# Patient Record
Sex: Female | Born: 1945 | Race: Black or African American | Hispanic: No | Marital: Married | Smoking: Never smoker
Health system: Southern US, Community
[De-identification: ages and names within clinical notes are randomized; demographics above are authoritative.]

## PROBLEM LIST (undated history)

## (undated) ENCOUNTER — Ambulatory Visit: Admission: EM | Payer: Medicare Other

## (undated) DIAGNOSIS — E119 Type 2 diabetes mellitus without complications: Secondary | ICD-10-CM

## (undated) DIAGNOSIS — E785 Hyperlipidemia, unspecified: Secondary | ICD-10-CM

## (undated) DIAGNOSIS — I10 Essential (primary) hypertension: Secondary | ICD-10-CM

## (undated) DIAGNOSIS — K589 Irritable bowel syndrome without diarrhea: Secondary | ICD-10-CM

## (undated) DIAGNOSIS — M549 Dorsalgia, unspecified: Secondary | ICD-10-CM

## (undated) DIAGNOSIS — K5792 Diverticulitis of intestine, part unspecified, without perforation or abscess without bleeding: Secondary | ICD-10-CM

## (undated) DIAGNOSIS — K219 Gastro-esophageal reflux disease without esophagitis: Secondary | ICD-10-CM

## (undated) DIAGNOSIS — M47812 Spondylosis without myelopathy or radiculopathy, cervical region: Secondary | ICD-10-CM

## (undated) DIAGNOSIS — R42 Dizziness and giddiness: Secondary | ICD-10-CM

## (undated) DIAGNOSIS — F419 Anxiety disorder, unspecified: Secondary | ICD-10-CM

## (undated) DIAGNOSIS — M810 Age-related osteoporosis without current pathological fracture: Secondary | ICD-10-CM

## (undated) HISTORY — DX: Anxiety disorder, unspecified: F41.9

## (undated) HISTORY — DX: Age-related osteoporosis without current pathological fracture: M81.0

## (undated) HISTORY — PX: ABDOMINAL HYSTERECTOMY: SHX81

## (undated) HISTORY — DX: Dizziness and giddiness: R42

## (undated) HISTORY — DX: Diverticulitis of intestine, part unspecified, without perforation or abscess without bleeding: K57.92

## (undated) HISTORY — PX: JOINT REPLACEMENT: SHX530

## (undated) HISTORY — PX: OTHER SURGICAL HISTORY: SHX169

## (undated) HISTORY — DX: Dorsalgia, unspecified: M54.9

## (undated) HISTORY — DX: Spondylosis without myelopathy or radiculopathy, cervical region: M47.812

## (undated) HISTORY — DX: Irritable bowel syndrome, unspecified: K58.9

---

## 2005-06-05 ENCOUNTER — Ambulatory Visit: Payer: Self-pay | Admitting: General Practice

## 2006-06-10 ENCOUNTER — Ambulatory Visit: Payer: Self-pay | Admitting: Internal Medicine

## 2007-07-28 ENCOUNTER — Ambulatory Visit: Payer: Self-pay | Admitting: Nurse Practitioner

## 2008-01-14 ENCOUNTER — Other Ambulatory Visit: Payer: Self-pay

## 2008-01-14 ENCOUNTER — Emergency Department: Payer: Self-pay | Admitting: Internal Medicine

## 2008-01-18 ENCOUNTER — Ambulatory Visit: Payer: Self-pay | Admitting: Internal Medicine

## 2008-08-07 ENCOUNTER — Ambulatory Visit: Payer: Self-pay | Admitting: Family Medicine

## 2009-06-06 ENCOUNTER — Ambulatory Visit: Payer: Self-pay | Admitting: Family Medicine

## 2009-07-06 ENCOUNTER — Ambulatory Visit: Payer: Self-pay | Admitting: Family Medicine

## 2009-08-06 ENCOUNTER — Ambulatory Visit: Payer: Self-pay | Admitting: Family Medicine

## 2009-11-20 ENCOUNTER — Ambulatory Visit: Payer: Self-pay

## 2010-11-28 ENCOUNTER — Ambulatory Visit: Payer: Self-pay | Admitting: Family Medicine

## 2011-02-09 ENCOUNTER — Emergency Department: Payer: Self-pay | Admitting: Emergency Medicine

## 2011-03-01 ENCOUNTER — Emergency Department: Payer: Self-pay | Admitting: Emergency Medicine

## 2011-04-25 ENCOUNTER — Ambulatory Visit: Payer: Self-pay | Admitting: Specialist

## 2011-05-02 ENCOUNTER — Ambulatory Visit: Payer: Self-pay | Admitting: Specialist

## 2011-05-22 ENCOUNTER — Ambulatory Visit: Payer: Self-pay | Admitting: Specialist

## 2012-01-07 ENCOUNTER — Ambulatory Visit: Payer: Self-pay | Admitting: Family Medicine

## 2012-05-06 ENCOUNTER — Ambulatory Visit: Payer: Self-pay | Admitting: Specialist

## 2012-05-18 ENCOUNTER — Ambulatory Visit: Payer: Self-pay | Admitting: Specialist

## 2012-05-18 DIAGNOSIS — I10 Essential (primary) hypertension: Secondary | ICD-10-CM

## 2012-05-26 ENCOUNTER — Ambulatory Visit: Payer: Self-pay | Admitting: Specialist

## 2013-01-11 ENCOUNTER — Ambulatory Visit: Payer: Self-pay | Admitting: Family Medicine

## 2014-01-12 ENCOUNTER — Ambulatory Visit: Payer: Self-pay | Admitting: Internal Medicine

## 2015-01-16 ENCOUNTER — Ambulatory Visit
Admit: 2015-01-16 | Disposition: A | Discharge status: Home / Self Care | Payer: Self-pay | Attending: Internal Medicine | Admitting: Internal Medicine

## 2015-01-23 NOTE — Op Note (Signed)
PATIENT NAME:  Lindsey Choi, Lindsey Choi MR#:  [id number] DATE OF BIRTH:  04-23-1946  DATE OF PROCEDURE:  05/26/2012  PREOPERATIVE DIAGNOSES:  1. Severe tearing, maceration of left lateral meniscus with anterior extrusion of the meniscus.  2. 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 3. Reactive synovitis.   POSTOPERATIVE DIAGNOSES:  1. Severe tearing, maceration of left lateral meniscus with anterior extrusion of the meniscus.  2. 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 3. Reactive synovitis.   PROCEDURES:  1. Arthroscopic partial lateral meniscectomy.  2. Arthroscopic chondroplasty of lateral femoral condyle and tibia.  3. Partial synovectomy.   SURGEON: Valinda HoarHoward E. Huntley Knoop, MD   ANESTHESIA: General LMA.   COMPLICATIONS: None.   DRAINS: None.   OPERATIVE FINDINGS: The patient had severe maceration of the lateral meniscus with anterior fragment extruded and flipped over between joint surfaces. The posterior portion was macerated. There was eburnated bone on the lateral tibial plateau and some of the femur. Articularly the weight-bearing surface did not have as much damage. The intercondylar notch was normal with normal ligaments and the medial compartment was normal. Patellofemoral joint shows minimal wear. There were no loose bodies. The suprapatellar pouch was intact.   DESCRIPTION OF PROCEDURE: The patient was brought to the Operating Room where she underwent satisfactory general LMA anesthesia in the supine position. The left leg was prepped and draped in sterile fashion and arthroscopy carried out through standard portals. The above findings were encountered. The lateral meniscus was debrided with basket forceps, motorized resector and ArthroCare wand back to healthy clean tissue which did not impinge in the joint. The lateral femoral condyle and tibia were minimally touched up. The synovitis was removed with the motorized resector and the ArthroCare  wand. The remainder of the joint did not require any significant treatment. Pump pressure was decreased to allow for cauterization of bleeders. The wound was thoroughly irrigated and stab wounds closed with 3-0 nylon suture. 0.5% Marcaine with epinephrine and morphine was placed in the joint. The tourniquet was not used. A dry sterile dressing was applied and the patient was awakened and taken to recovery in good condition. ____________________________ Valinda HoarHoward E. Agnes Brightbill, MD hem:slb D: 05/26/2012 13:08:08 ET T: 05/26/2012 14:36:00 ET JOB#: [id number]  cc: Valinda HoarHoward E. Satya Buttram, MD, <Dictator> Valinda HoarHOWARD E Tamarius Rosenfield MD ELECTRONICALLY SIGNED 05/26/2012 15:20

## 2015-12-12 ENCOUNTER — Other Ambulatory Visit: Payer: Self-pay | Admitting: Family

## 2015-12-12 DIAGNOSIS — Z1231 Encounter for screening mammogram for malignant neoplasm of breast: Secondary | ICD-10-CM

## 2016-01-17 ENCOUNTER — Ambulatory Visit
Admission: RE | Admit: 2016-01-17 | Discharge: 2016-01-17 | Disposition: A | Discharge status: Home / Self Care | Payer: Medicare Other | Source: Ambulatory Visit | Attending: Family | Admitting: Family

## 2016-01-17 DIAGNOSIS — Z1231 Encounter for screening mammogram for malignant neoplasm of breast: Secondary | ICD-10-CM | POA: Insufficient documentation

## 2016-11-27 ENCOUNTER — Other Ambulatory Visit: Payer: Self-pay | Admitting: Specialist

## 2016-11-27 DIAGNOSIS — Z1231 Encounter for screening mammogram for malignant neoplasm of breast: Secondary | ICD-10-CM

## 2017-01-20 ENCOUNTER — Ambulatory Visit
Admission: RE | Admit: 2017-01-20 | Discharge: 2017-01-20 | Disposition: A | Discharge status: Home / Self Care | Payer: Medicare Other | Source: Ambulatory Visit | Attending: Specialist | Admitting: Specialist

## 2017-01-20 DIAGNOSIS — Z1231 Encounter for screening mammogram for malignant neoplasm of breast: Secondary | ICD-10-CM

## 2017-03-09 ENCOUNTER — Other Ambulatory Visit: Payer: Self-pay | Admitting: Specialist

## 2017-03-11 ENCOUNTER — Encounter
Admission: RE | Admit: 2017-03-11 | Discharge: 2017-03-11 | Disposition: A | Discharge status: Home / Self Care | Payer: Medicare Other | Source: Ambulatory Visit | Attending: Specialist | Admitting: Specialist

## 2017-03-11 DIAGNOSIS — M25862 Other specified joint disorders, left knee: Secondary | ICD-10-CM | POA: Insufficient documentation

## 2017-03-11 DIAGNOSIS — Z01812 Encounter for preprocedural laboratory examination: Secondary | ICD-10-CM | POA: Insufficient documentation

## 2017-03-11 LAB — URINALYSIS, ROUTINE W REFLEX MICROSCOPIC
Bilirubin Urine: NEGATIVE
GLUCOSE, UA: NEGATIVE mg/dL
Hgb urine dipstick: NEGATIVE
Ketones, ur: NEGATIVE mg/dL
Nitrite: NEGATIVE
PROTEIN: NEGATIVE mg/dL
Specific Gravity, Urine: 1.003 — ABNORMAL LOW (ref 1.005–1.030)
pH: 6 (ref 5.0–8.0)

## 2017-03-11 LAB — CBC WITH DIFFERENTIAL/PLATELET
BASOS ABS: 0 10*3/uL (ref 0–0.1)
BASOS PCT: 1 %
EOS ABS: 0.1 10*3/uL (ref 0–0.7)
Eosinophils Relative: 2 %
HCT: 36.8 % (ref 35.0–47.0)
Hemoglobin: 12.4 g/dL (ref 12.0–16.0)
Lymphocytes Relative: 38 %
Lymphs Abs: 2.5 10*3/uL (ref 1.0–3.6)
MCH: 28.6 pg (ref 26.0–34.0)
MCHC: 33.6 g/dL (ref 32.0–36.0)
MCV: 85.2 fL (ref 80.0–100.0)
MONO ABS: 0.4 10*3/uL (ref 0.2–0.9)
MONOS PCT: 6 %
Neutro Abs: 3.5 10*3/uL (ref 1.4–6.5)
Neutrophils Relative %: 53 %
PLATELETS: 325 10*3/uL (ref 150–440)
RBC: 4.33 MIL/uL (ref 3.80–5.20)
RDW: 13.6 % (ref 11.5–14.5)
WBC: 6.6 10*3/uL (ref 3.6–11.0)

## 2017-03-11 LAB — COMPREHENSIVE METABOLIC PANEL
ALBUMIN: 4.5 g/dL (ref 3.5–5.0)
ALT: 15 U/L (ref 14–54)
ANION GAP: 7 (ref 5–15)
AST: 16 U/L (ref 15–41)
Alkaline Phosphatase: 46 U/L (ref 38–126)
BUN: 13 mg/dL (ref 6–20)
CHLORIDE: 102 mmol/L (ref 101–111)
CO2: 29 mmol/L (ref 22–32)
Calcium: 10 mg/dL (ref 8.9–10.3)
Creatinine, Ser: 0.91 mg/dL (ref 0.44–1.00)
GFR calc Af Amer: >60 mL/min (ref >60)
GFR calc non Af Amer: >60 mL/min (ref >60)
GLUCOSE: 100 mg/dL — AB (ref 65–99)
POTASSIUM: 3.4 mmol/L — AB (ref 3.5–5.1)
SODIUM: 138 mmol/L (ref 135–145)
TOTAL PROTEIN: 7.9 g/dL (ref 6.5–8.1)
Total Bilirubin: 1.3 mg/dL — ABNORMAL HIGH (ref 0.3–1.2)

## 2017-03-11 LAB — PROTIME-INR
INR: 1.06
Prothrombin Time: 13.8 seconds (ref 11.4–15.2)

## 2017-03-11 LAB — SURGICAL PCR SCREEN
MRSA, PCR: NEGATIVE
Staphylococcus aureus: POSITIVE — AB

## 2017-03-11 LAB — TYPE AND SCREEN
ABO/RH(D): O POS
ANTIBODY SCREEN: NEGATIVE

## 2017-03-11 LAB — APTT: APTT: 33 s (ref 24–36)

## 2017-03-11 MED ORDER — VANCOMYCIN HCL IN DEXTROSE 1-5 GM/200ML-% IV SOLN
INTRAVENOUS | Status: AC
  Filled 2017-03-11: qty 200

## 2017-03-11 NOTE — Patient Instructions (Signed)
Your procedure is scheduled on: 03/25/17 Report to Same Day Surgery 2nd floor medical mall [facility] Entrance-take elevator on left to 2nd floor.  Check in with surgery information desk.) To find out your arrival time please call [phone] between 1PM - 3PM on  03/24/17  Remember: Instructions that are not followed completely may result in serious medical risk, up to and including death, or upon the discretion of your surgeon and anesthesiologist your surgery may need to be rescheduled.    _x___ 1. Do not eat food or drink liquids after midnight. No gum chewing or                              hard candies.     __x__ 2. No Alcohol for 24 hours before or after surgery.   __x__3. No Smoking for 24 prior to surgery.   ____  4. Bring all medications with you on the day of surgery if instructed.    __x__ 5. Notify your doctor if there is any change in your medical condition     (cold, fever, infections).     Do not wear jewelry, make-up, hairpins, clips or nail polish.  Do not wear lotions, powders, or perfumes. You may wear deodorant.  Do not shave 48 hours prior to surgery. Men may shave face and neck.  Do not bring valuables to the hospital.    [facility] is not responsible for any belongings or valuables.               Contacts, dentures or bridgework may not be worn into surgery.  Leave your suitcase in the car. After surgery it may be brought to your room.  For patients admitted to the hospital, discharge time is determined by your                       treatment team.   Patients discharged the day of surgery will not be allowed to drive home.  You will need someone to drive you home and stay with you the night of your procedure.    Please read over the following fact sheets that you were given:   [facility] Preparing for Surgery and or MRSA Information   _x___ Take anti-hypertensive (unless it includes a diuretic), cardiac, seizure, asthma,     anti-reflux and psychiatric  medicines. These include:  1. ATORVASTATIN  2. PEPCID  3.  4.  5.  6.  ____Fleets enema or Magnesium Citrate as directed.   _x___ Use CHG Soap or sage wipes as directed on instruction sheet   ____ Use inhalers on the day of surgery and bring to hospital day of surgery  ____ Stop Metformin and Janumet 2 days prior to surgery.    ____ Take 1/2 of usual insulin dose the night before surgery and none on the morning     surgery.   _x___ Follow recommendations from Cardiologist, Pulmonologist or PCP regarding          stopping Aspirin, Coumadin, Pllavix ,Eliquis, Effient, or Pradaxa, and Pletal.            STOP ASPIRIN 7 DAYS BEFORE SURGERY X____Stop Anti-inflammatories such as Advil, Aleve, Ibuprofen, Motrin, Naproxen, Naprosyn, Goodies powders or aspirin products. OK to take Tylenol an  Celebrex.   _x___ Stop supplements until after surgery.  But may continue Vitamin D, Vitamin B, and multivitamin.  STOP FISH OIL UNTIL AFTER SURGERY  ____ Bring C-Pap to the hospital.

## 2017-03-12 LAB — HEMOGLOBIN A1C
HEMOGLOBIN A1C: 6.2 % — AB (ref 4.8–5.6)
MEAN PLASMA GLUCOSE: 131 mg/dL

## 2017-03-12 NOTE — Pre-Procedure Instructions (Signed)
Lindsey Choi AT [facility]. DR Hyacinth MeekerMILLER CALLED IN MUCIPROCIN AND WANTS HER TO USE CHG SOAP UNTIL SURGERY. PATIENT CAME BY PAT AND GIVEN MORE CHG SOAP

## 2017-03-25 ENCOUNTER — Inpatient Hospital Stay
Admission: RE | Admit: 2017-03-25 | Discharge: 2017-03-27 | DRG: 470 | Disposition: A | Discharge status: Skilled Nursing Facility | Payer: Medicare Other | Source: Ambulatory Visit | Attending: Specialist | Admitting: Specialist

## 2017-03-25 ENCOUNTER — Encounter
Admission: RE | Disposition: A | Discharge status: Skilled Nursing Facility | Payer: Self-pay | Source: Ambulatory Visit | Attending: Specialist

## 2017-03-25 ENCOUNTER — Inpatient Hospital Stay: Payer: Medicare Other

## 2017-03-25 ENCOUNTER — Inpatient Hospital Stay: Payer: Medicare Other | Admitting: Certified Registered Nurse Anesthetist

## 2017-03-25 ENCOUNTER — Encounter: Payer: Self-pay | Admitting: *Deleted

## 2017-03-25 DIAGNOSIS — R7303 Prediabetes: Secondary | ICD-10-CM | POA: Diagnosis present

## 2017-03-25 DIAGNOSIS — Z7982 Long term (current) use of aspirin: Secondary | ICD-10-CM | POA: Diagnosis not present

## 2017-03-25 DIAGNOSIS — K219 Gastro-esophageal reflux disease without esophagitis: Secondary | ICD-10-CM | POA: Diagnosis present

## 2017-03-25 DIAGNOSIS — M25562 Pain in left knee: Secondary | ICD-10-CM | POA: Diagnosis present

## 2017-03-25 DIAGNOSIS — Z96659 Presence of unspecified artificial knee joint: Secondary | ICD-10-CM

## 2017-03-25 DIAGNOSIS — M1712 Unilateral primary osteoarthritis, left knee: Secondary | ICD-10-CM | POA: Diagnosis present

## 2017-03-25 HISTORY — DX: Gastro-esophageal reflux disease without esophagitis: K21.9

## 2017-03-25 HISTORY — DX: Type 2 diabetes mellitus without complications: E11.9

## 2017-03-25 HISTORY — DX: Hyperlipidemia, unspecified: E78.5

## 2017-03-25 HISTORY — DX: Essential (primary) hypertension: I10

## 2017-03-25 HISTORY — PX: TOTAL KNEE ARTHROPLASTY: SHX125

## 2017-03-25 LAB — CREATININE, SERUM
CREATININE: 0.81 mg/dL (ref 0.44–1.00)
GFR calc Af Amer: >60 mL/min (ref >60)
GFR calc non Af Amer: >60 mL/min (ref >60)

## 2017-03-25 LAB — CBC
HCT: 33.7 % — ABNORMAL LOW (ref 35.0–47.0)
HEMOGLOBIN: 11.6 g/dL — AB (ref 12.0–16.0)
MCH: 29.4 pg (ref 26.0–34.0)
MCHC: 34.5 g/dL (ref 32.0–36.0)
MCV: 85.1 fL (ref 80.0–100.0)
Platelets: 283 10*3/uL (ref 150–440)
RBC: 3.96 MIL/uL (ref 3.80–5.20)
RDW: 13.3 % (ref 11.5–14.5)
WBC: 8.2 10*3/uL (ref 3.6–11.0)

## 2017-03-25 LAB — ABO/RH: ABO/RH(D): O POS

## 2017-03-25 SURGERY — ARTHROPLASTY, KNEE, TOTAL
Anesthesia: Spinal | Laterality: Left | Wound class: Clean

## 2017-03-25 MED ORDER — SODIUM CHLORIDE 0.45 % IV SOLN
INTRAVENOUS | Status: DC
  Administered 2017-03-25 – 2017-03-26 (×2): via INTRAVENOUS

## 2017-03-25 MED ORDER — ENOXAPARIN SODIUM 40 MG/0.4ML ~~LOC~~ SOLN
40.0000 mg | SUBCUTANEOUS | Status: DC
  Administered 2017-03-26 – 2017-03-27 (×2): 40 mg via SUBCUTANEOUS
  Filled 2017-03-25 (×2): qty 0.4

## 2017-03-25 MED ORDER — CELECOXIB 200 MG PO CAPS
ORAL_CAPSULE | ORAL | Status: AC
  Administered 2017-03-25: 400 mg via ORAL
  Filled 2017-03-25: qty 2

## 2017-03-25 MED ORDER — SODIUM CHLORIDE 0.9 % IV SOLN
INTRAVENOUS | Status: DC | PRN
  Administered 2017-03-25: 1000 mg via INTRAVENOUS

## 2017-03-25 MED ORDER — CYCLOBENZAPRINE HCL 10 MG PO TABS: 10.0000 mg | ORAL_TABLET | Freq: Three times a day (TID) | ORAL | Status: DC | PRN

## 2017-03-25 MED ORDER — METHOCARBAMOL 1000 MG/10ML IJ SOLN
500.0000 mg | Freq: Four times a day (QID) | INTRAVENOUS | Status: DC | PRN
  Filled 2017-03-25: qty 5

## 2017-03-25 MED ORDER — PROPOFOL 500 MG/50ML IV EMUL
INTRAVENOUS | Status: AC
  Filled 2017-03-25: qty 50

## 2017-03-25 MED ORDER — GLYCOPYRROLATE 0.2 MG/ML IJ SOLN
INTRAMUSCULAR | Status: DC | PRN
  Administered 2017-03-25: 0.2 mg via INTRAVENOUS

## 2017-03-25 MED ORDER — VITAMIN C 500 MG PO TABS
500.0000 mg | ORAL_TABLET | Freq: Two times a day (BID) | ORAL | Status: DC
  Administered 2017-03-26 – 2017-03-27 (×3): 500 mg via ORAL
  Filled 2017-03-25 (×5): qty 1

## 2017-03-25 MED ORDER — PROPOFOL 500 MG/50ML IV EMUL
INTRAVENOUS | Status: DC | PRN
  Administered 2017-03-25: 50 ug/kg/min via INTRAVENOUS

## 2017-03-25 MED ORDER — ACETAMINOPHEN 325 MG PO TABS
650.0000 mg | ORAL_TABLET | Freq: Four times a day (QID) | ORAL | Status: DC | PRN
  Administered 2017-03-26: 650 mg via ORAL
  Filled 2017-03-25: qty 2

## 2017-03-25 MED ORDER — TETRACAINE HCL 1 % IJ SOLN
INTRAMUSCULAR | Status: DC | PRN
  Administered 2017-03-25: 10 mg via INTRASPINAL

## 2017-03-25 MED ORDER — METHOCARBAMOL 500 MG PO TABS: 500.0000 mg | ORAL_TABLET | Freq: Four times a day (QID) | ORAL | Status: DC | PRN

## 2017-03-25 MED ORDER — MORPHINE SULFATE (PF) 4 MG/ML IV SOLN
INTRAVENOUS | Status: AC
  Filled 2017-03-25: qty 1

## 2017-03-25 MED ORDER — PHENOL 1.4 % MT LIQD
1.0000 | OROMUCOSAL | Status: DC | PRN
  Filled 2017-03-25: qty 177

## 2017-03-25 MED ORDER — EPHEDRINE SULFATE 50 MG/ML IJ SOLN
INTRAMUSCULAR | Status: DC | PRN
  Administered 2017-03-25 (×2): 10 mg via INTRAVENOUS

## 2017-03-25 MED ORDER — NEOMYCIN-POLYMYXIN B GU 40-200000 IR SOLN
Status: AC
  Filled 2017-03-25: qty 20

## 2017-03-25 MED ORDER — SEVOFLURANE IN SOLN
RESPIRATORY_TRACT | Status: AC
  Filled 2017-03-25: qty 250

## 2017-03-25 MED ORDER — ENOXAPARIN SODIUM 30 MG/0.3ML ~~LOC~~ SOLN: 30.0000 mg | SUBCUTANEOUS | Status: DC

## 2017-03-25 MED ORDER — CHLORHEXIDINE GLUCONATE CLOTH 2 % EX PADS
6.0000 | MEDICATED_PAD | Freq: Once | CUTANEOUS | Status: AC
  Administered 2017-03-11: 6 via TOPICAL

## 2017-03-25 MED ORDER — ZOLPIDEM TARTRATE 5 MG PO TABS: 5.0000 mg | ORAL_TABLET | Freq: Every evening | ORAL | Status: DC | PRN

## 2017-03-25 MED ORDER — BUPIVACAINE HCL (PF) 0.5 % IJ SOLN
INTRAMUSCULAR | Status: AC
Start: 2017-03-25 — End: 2017-03-25
  Filled 2017-03-25: qty 10

## 2017-03-25 MED ORDER — BISACODYL 10 MG RE SUPP: 10.0000 mg | Freq: Every day | RECTAL | Status: DC | PRN

## 2017-03-25 MED ORDER — VANCOMYCIN HCL IN DEXTROSE 1-5 GM/200ML-% IV SOLN
INTRAVENOUS | Status: AC
  Administered 2017-03-25: 1000 mg via INTRAVENOUS
  Filled 2017-03-25: qty 200

## 2017-03-25 MED ORDER — METOCLOPRAMIDE HCL 10 MG PO TABS: 5.0000 mg | ORAL_TABLET | Freq: Three times a day (TID) | ORAL | Status: DC | PRN

## 2017-03-25 MED ORDER — BUPIVACAINE HCL (PF) 0.5 % IJ SOLN
INTRAMUSCULAR | Status: DC | PRN
  Administered 2017-03-25: 2 mL

## 2017-03-25 MED ORDER — VANCOMYCIN HCL IN DEXTROSE 1-5 GM/200ML-% IV SOLN
1000.0000 mg | INTRAVENOUS | Status: AC
  Administered 2017-03-25: 1000 mg via INTRAVENOUS

## 2017-03-25 MED ORDER — FENTANYL CITRATE (PF) 100 MCG/2ML IJ SOLN
INTRAMUSCULAR | Status: AC
  Filled 2017-03-25: qty 2

## 2017-03-25 MED ORDER — ONDANSETRON HCL 4 MG PO TABS
4.0000 mg | ORAL_TABLET | Freq: Four times a day (QID) | ORAL | Status: DC | PRN
  Administered 2017-03-25: 4 mg via ORAL
  Filled 2017-03-25: qty 1

## 2017-03-25 MED ORDER — ONDANSETRON HCL 4 MG/2ML IJ SOLN: 4.0000 mg | Freq: Four times a day (QID) | INTRAMUSCULAR | Status: DC | PRN

## 2017-03-25 MED ORDER — FAMOTIDINE 20 MG PO TABS
ORAL_TABLET | ORAL | Status: AC
  Administered 2017-03-25: 20 mg
  Filled 2017-03-25: qty 1

## 2017-03-25 MED ORDER — SENNA 8.6 MG PO TABS
1.0000 | ORAL_TABLET | Freq: Two times a day (BID) | ORAL | Status: DC
  Administered 2017-03-25 – 2017-03-26 (×2): 8.6 mg via ORAL
  Filled 2017-03-25 (×4): qty 1

## 2017-03-25 MED ORDER — MORPHINE SULFATE (PF) 4 MG/ML IV SOLN
INTRAVENOUS | Status: DC | PRN
  Administered 2017-03-25: 4 mg via INTRAVENOUS

## 2017-03-25 MED ORDER — CELECOXIB 200 MG PO CAPS
200.0000 mg | ORAL_CAPSULE | Freq: Two times a day (BID) | ORAL | Status: DC
  Administered 2017-03-25 – 2017-03-27 (×4): 200 mg via ORAL
  Filled 2017-03-25 (×4): qty 1

## 2017-03-25 MED ORDER — MAGNESIUM HYDROXIDE 400 MG/5ML PO SUSP
30.0000 mL | Freq: Every day | ORAL | Status: DC | PRN
  Administered 2017-03-26: 30 mL via ORAL
  Filled 2017-03-25: qty 30

## 2017-03-25 MED ORDER — ACETAMINOPHEN 650 MG RE SUPP: 650.0000 mg | Freq: Four times a day (QID) | RECTAL | Status: DC | PRN

## 2017-03-25 MED ORDER — DIPHENHYDRAMINE HCL 12.5 MG/5ML PO ELIX
12.5000 mg | ORAL_SOLUTION | ORAL | Status: DC | PRN
Start: 2017-03-25 — End: 2017-03-27

## 2017-03-25 MED ORDER — ATORVASTATIN CALCIUM 10 MG PO TABS
10.0000 mg | ORAL_TABLET | Freq: Every morning | ORAL | Status: DC
  Administered 2017-03-26 – 2017-03-27 (×2): 10 mg via ORAL
  Filled 2017-03-25 (×2): qty 1

## 2017-03-25 MED ORDER — PREGABALIN 75 MG PO CAPS
75.0000 mg | ORAL_CAPSULE | Freq: Once | ORAL | Status: AC
  Administered 2017-03-25: 75 mg via ORAL

## 2017-03-25 MED ORDER — SODIUM CHLORIDE 0.9 % IJ SOLN
INTRAMUSCULAR | Status: AC
Start: 2017-03-25 — End: 2017-03-25
  Filled 2017-03-25: qty 50

## 2017-03-25 MED ORDER — MENTHOL 3 MG MT LOZG
1.0000 | LOZENGE | OROMUCOSAL | Status: DC | PRN
  Filled 2017-03-25: qty 9

## 2017-03-25 MED ORDER — PREGABALIN 75 MG PO CAPS
ORAL_CAPSULE | ORAL | Status: AC
  Administered 2017-03-25: 75 mg via ORAL
  Filled 2017-03-25: qty 1

## 2017-03-25 MED ORDER — ONDANSETRON HCL 4 MG/2ML IJ SOLN
INTRAMUSCULAR | Status: AC
Start: 2017-03-25 — End: 2017-03-26
  Filled 2017-03-25: qty 2

## 2017-03-25 MED ORDER — CELECOXIB 200 MG PO CAPS
400.0000 mg | ORAL_CAPSULE | ORAL | Status: AC
  Administered 2017-03-25: 400 mg via ORAL

## 2017-03-25 MED ORDER — BUPIVACAINE HCL (PF) 0.25 % IJ SOLN
INTRAMUSCULAR | Status: DC | PRN
  Administered 2017-03-25: 30 mL

## 2017-03-25 MED ORDER — GABAPENTIN 300 MG PO CAPS
300.0000 mg | ORAL_CAPSULE | Freq: Three times a day (TID) | ORAL | Status: DC
  Administered 2017-03-25 – 2017-03-27 (×6): 300 mg via ORAL
  Filled 2017-03-25 (×6): qty 1

## 2017-03-25 MED ORDER — HYDROCODONE-ACETAMINOPHEN 7.5-325 MG PO TABS: 1.0000 | ORAL_TABLET | ORAL | Status: DC | PRN

## 2017-03-25 MED ORDER — FERROUS SULFATE 325 (65 FE) MG PO TABS
325.0000 mg | ORAL_TABLET | Freq: Three times a day (TID) | ORAL | Status: DC
  Administered 2017-03-25 – 2017-03-27 (×6): 325 mg via ORAL
  Filled 2017-03-25 (×6): qty 1

## 2017-03-25 MED ORDER — BUPIVACAINE HCL (PF) 0.5 % IJ SOLN
INTRAMUSCULAR | Status: AC
  Filled 2017-03-25: qty 30

## 2017-03-25 MED ORDER — GLYCOPYRROLATE 0.2 MG/ML IJ SOLN
INTRAMUSCULAR | Status: AC
Start: 2017-03-25 — End: 2017-03-25
  Filled 2017-03-25: qty 1

## 2017-03-25 MED ORDER — TRANEXAMIC ACID 1000 MG/10ML IV SOLN
1000.0000 mg | Freq: Once | INTRAVENOUS | Status: AC
  Administered 2017-03-25: 1000 mg via INTRAVENOUS
  Filled 2017-03-25: qty 10

## 2017-03-25 MED ORDER — TRANEXAMIC ACID 1000 MG/10ML IV SOLN
1000.0000 mg | INTRAVENOUS | Status: DC
  Filled 2017-03-25: qty 10

## 2017-03-25 MED ORDER — CALCIUM CARBONATE-VITAMIN D 500-200 MG-UNIT PO TABS
ORAL_TABLET | Freq: Every day | ORAL | Status: DC
Start: 2017-03-25 — End: 2017-03-27
  Administered 2017-03-26 – 2017-03-27 (×2): 1 via ORAL
  Filled 2017-03-25 (×2): qty 1

## 2017-03-25 MED ORDER — OMEGA-3-ACID ETHYL ESTERS 1 G PO CAPS
1.0000 g | ORAL_CAPSULE | Freq: Every day | ORAL | Status: DC
  Administered 2017-03-26 – 2017-03-27 (×2): 1 g via ORAL
  Filled 2017-03-25 (×2): qty 1

## 2017-03-25 MED ORDER — KETOROLAC TROMETHAMINE 30 MG/ML IJ SOLN
INTRAMUSCULAR | Status: AC
Start: 2017-03-25 — End: 2017-03-25
  Filled 2017-03-25: qty 1

## 2017-03-25 MED ORDER — LACTATED RINGERS IV SOLN
INTRAVENOUS | Status: DC
  Administered 2017-03-25 (×2): via INTRAVENOUS

## 2017-03-25 MED ORDER — FAMOTIDINE 20 MG PO TABS: 40.0000 mg | ORAL_TABLET | Freq: Two times a day (BID) | ORAL | Status: DC | PRN

## 2017-03-25 MED ORDER — SODIUM CHLORIDE 0.9 % IJ SOLN
INTRAMUSCULAR | Status: AC
  Filled 2017-03-25: qty 20

## 2017-03-25 MED ORDER — BUPIVACAINE LIPOSOME 1.3 % IJ SUSP
INTRAMUSCULAR | Status: DC | PRN
  Administered 2017-03-25: 60 mL

## 2017-03-25 MED ORDER — KETOROLAC TROMETHAMINE 30 MG/ML IJ SOLN
INTRAMUSCULAR | Status: DC | PRN
  Administered 2017-03-25: 30 mg via INTRAVENOUS

## 2017-03-25 MED ORDER — FLEET ENEMA 7-19 GM/118ML RE ENEM: 1.0000 | ENEMA | Freq: Once | RECTAL | Status: DC | PRN

## 2017-03-25 MED ORDER — ONDANSETRON HCL 4 MG/2ML IJ SOLN
4.0000 mg | Freq: Once | INTRAMUSCULAR | Status: AC | PRN
  Administered 2017-03-25: 4 mg via INTRAVENOUS

## 2017-03-25 MED ORDER — SODIUM CHLORIDE 0.9 % IV SOLN
INTRAVENOUS | Status: DC | PRN
  Administered 2017-03-25: 25 ug/min via INTRAVENOUS

## 2017-03-25 MED ORDER — BUPIVACAINE LIPOSOME 1.3 % IJ SUSP
INTRAMUSCULAR | Status: AC
  Filled 2017-03-25: qty 20

## 2017-03-25 MED ORDER — ACETAMINOPHEN 500 MG PO TABS
1000.0000 mg | ORAL_TABLET | Freq: Four times a day (QID) | ORAL | Status: AC | PRN
  Administered 2017-03-25 – 2017-03-26 (×2): 1000 mg via ORAL
  Filled 2017-03-25 (×2): qty 2

## 2017-03-25 MED ORDER — BUPIVACAINE HCL (PF) 0.25 % IJ SOLN
INTRAMUSCULAR | Status: AC
  Filled 2017-03-25: qty 30

## 2017-03-25 MED ORDER — MORPHINE SULFATE (PF) 2 MG/ML IV SOLN: 1.0000 mg | INTRAVENOUS | Status: DC | PRN

## 2017-03-25 MED ORDER — FENTANYL CITRATE (PF) 100 MCG/2ML IJ SOLN
INTRAMUSCULAR | Status: DC | PRN
  Administered 2017-03-25: 25 ug via INTRAVENOUS
  Administered 2017-03-25: 50 ug via INTRAVENOUS
  Administered 2017-03-25: 25 ug via INTRAVENOUS

## 2017-03-25 MED ORDER — ALUM & MAG HYDROXIDE-SIMETH 200-200-20 MG/5ML PO SUSP: 30.0000 mL | ORAL | Status: DC | PRN

## 2017-03-25 MED ORDER — FENTANYL CITRATE (PF) 100 MCG/2ML IJ SOLN: 25.0000 ug | INTRAMUSCULAR | Status: DC | PRN

## 2017-03-25 MED ORDER — METOCLOPRAMIDE HCL 5 MG/ML IJ SOLN
5.0000 mg | Freq: Three times a day (TID) | INTRAMUSCULAR | Status: DC | PRN
Start: 2017-03-25 — End: 2017-03-27

## 2017-03-25 MED ORDER — VANCOMYCIN HCL IN DEXTROSE 1-5 GM/200ML-% IV SOLN
1000.0000 mg | Freq: Two times a day (BID) | INTRAVENOUS | Status: AC
  Administered 2017-03-25 – 2017-03-26 (×2): 1000 mg via INTRAVENOUS
  Filled 2017-03-25 (×2): qty 200

## 2017-03-25 SURGICAL SUPPLY — 51 items
AUTOTRANSFUS HAS 1/8 (MISCELLANEOUS) ×3
BLADE DEBAKEY 8.0 (BLADE) ×4 IMPLANT
BLADE DEBAKEY 8.0MM (BLADE) ×2
BLADE SAGITTAL WIDE XTHICK NO (BLADE) ×3 IMPLANT
CANISTER SUCT 1200ML W/VALVE (MISCELLANEOUS) ×3 IMPLANT
CANISTER SUCT 3000ML PPV (MISCELLANEOUS) ×3 IMPLANT
CAP KNEE TOTAL 3 SIGMA ×3 IMPLANT
CATH TRAY METER 16FR LF (MISCELLANEOUS) ×3 IMPLANT
CEMENT HV SMART SET (Cement) ×6 IMPLANT
CHLORAPREP W/TINT 26ML (MISCELLANEOUS) ×6 IMPLANT
COOLER POLAR GLACIER W/PUMP (MISCELLANEOUS) ×3 IMPLANT
CUFF TOURN 24 STER (MISCELLANEOUS) IMPLANT
CUFF TOURN 30 STER DUAL PORT (MISCELLANEOUS) IMPLANT
DRAPE INCISE IOBAN 66X60 STRL (DRAPES) ×3 IMPLANT
DRAPE SHEET LG 3/4 BI-LAMINATE (DRAPES) ×6 IMPLANT
DRSG AQUACEL AG ADV 3.5X10 (GAUZE/BANDAGES/DRESSINGS) ×3 IMPLANT
DRSG AQUACEL AG ADV 3.5X14 (GAUZE/BANDAGES/DRESSINGS) ×3 IMPLANT
ELECT REM PT RETURN 9FT ADLT (ELECTROSURGICAL) ×3
ELECTRODE REM PT RTRN 9FT ADLT (ELECTROSURGICAL) ×1 IMPLANT
GLOVE BIO SURGEON STRL SZ7.5 (GLOVE) ×3 IMPLANT
GLOVE BIO SURGEON STRL SZ8 (GLOVE) ×3 IMPLANT
GLOVE BIOGEL PI IND STRL 8.5 (GLOVE) ×1 IMPLANT
GLOVE BIOGEL PI INDICATOR 8.5 (GLOVE) ×2
GLOVE INDICATOR 8.0 STRL GRN (GLOVE) ×3 IMPLANT
GLOVE SURG ORTHO 8.5 STRL (GLOVE) ×3 IMPLANT
GOWN STRL REUS W/ TWL LRG LVL4 (GOWN DISPOSABLE) ×1 IMPLANT
GOWN STRL REUS W/TWL LRG LVL4 (GOWN DISPOSABLE) ×5 IMPLANT
IMMBOLIZER KNEE 19 BLUE UNIV (SOFTGOODS) ×3 IMPLANT
KIT RM TURNOVER STRD PROC AR (KITS) ×3 IMPLANT
NEEDLE SPNL 20GX3.5 QUINCKE YW (NEEDLE) ×3 IMPLANT
NS IRRIG 1000ML POUR BTL (IV SOLUTION) ×3 IMPLANT
PACK TOTAL KNEE (MISCELLANEOUS) ×3 IMPLANT
PAD WRAPON POLAR KNEE (MISCELLANEOUS) ×1 IMPLANT
PULSAVAC PLUS IRRIG FAN TIP (DISPOSABLE) ×3
SOL .9 NS 3000ML IRR  AL (IV SOLUTION) ×2
SOL .9 NS 3000ML IRR UROMATIC (IV SOLUTION) ×1 IMPLANT
SPONGE LAP 18X18 5 PK (GAUZE/BANDAGES/DRESSINGS) IMPLANT
STAPLER SKIN PROX 35W (STAPLE) ×3 IMPLANT
SUCTION FRAZIER HANDLE 10FR (MISCELLANEOUS) ×2
SUCTION TUBE FRAZIER 10FR DISP (MISCELLANEOUS) ×1 IMPLANT
SUT BONE WAX W31G (SUTURE) ×3 IMPLANT
SUT DVC 2 QUILL PDO  T11 36X36 (SUTURE) ×2
SUT DVC 2 QUILL PDO T11 36X36 (SUTURE) ×1 IMPLANT
SUT QUILL PDO 0 36 36 VIOLET (SUTURE) ×3 IMPLANT
SYR 20CC LL (SYRINGE) ×9 IMPLANT
SYSTEM AUTOTRANSFUS DUAL TROCR (MISCELLANEOUS) ×1 IMPLANT
TAPE MICROFOAM 4IN (TAPE) ×3 IMPLANT
TIP FAN IRRIG PULSAVAC PLUS (DISPOSABLE) ×1 IMPLANT
TOWER CARTRIDGE SMART MIX (DISPOSABLE) ×3 IMPLANT
TUBE SUCT KAM VAC (TUBING) ×3 IMPLANT
WRAPON POLAR PAD KNEE (MISCELLANEOUS) ×3

## 2017-03-25 NOTE — Op Note (Signed)
DATE OF SURGERY:  03/25/2017 TIME: 10:15 AM  PATIENT NAME:  Lindsey PellegriniBarbara R Heidinger   AGE: 71 y.o.    PRE-OPERATIVE DIAGNOSIS:  M17.12 Unilateral primary osteoarthritis, left knee  POST-OPERATIVE DIAGNOSIS:  Same  PROCEDURE:  Procedure(s): TOTAL KNEE ARTHROPLASTY DEPUY ROTATING PLATFORM LCS LEFT   SURGEON:  Mckynzi Cammon E, MD   ASSISTANT:   OPERATIVE IMPLANTS: Depuy LCS Femur/Patella size STANDARD +, Tibia size # 4,  Rotating platform polyethylene size 10  mm  Total tourniquet time was 102 minutes.  PREOPERATIVE INDICATIONS:  Lindsey PellegriniBarbara R Creger is a 71 y.o. year old female with end stage bone on bone degenerative arthritis of the knee who failed conservative treatment, including injections, antiinflammatories, activity modification, and assistive devices, and had significant impairment of their activities of daily living, and elected for Total Knee Arthroplasty.   The risks, benefits, and alternatives were discussed at length including but not limited to the risks of infection, bleeding, nerve injury, stiffness, blood clots, the need for revision surgery, cardiopulmonary complications, among others, and they were willing to proceed.  OPERATIVE FINDINGS AND UNIQUE ASPECTS OF THE CASE:  SEVERE LATERAL BONE EROSION  OPERATIVE DESCRIPTION:   The patient was brought to the operative room and placed in a supine position. Spinal anesthesia was administered. IV VANCOMYCIN antibiotics were given. The lower extremity was prepped and draped in the usual sterile fashion. Time out was performed. The leg was elevated and exsanguinated and the tourniquet was inflated to 350 mmHg  An anterior midline incision was made.  Anterior quadriceps tendon splitting approach was performed. The patella was everted and osteophytes were removed. The anterior horn of the medial and lateral meniscus was removed.  Then the extramedullary tibial cutting jig was utilized making the appropriate cut using the anterior tibial  crest as a reference building in appropriate posterior slope. Care was taken during the cut to protect the medial and collateral ligaments. The proximal tibia was removed along with the posterior horns of the menisci. The PCL was sacrificed.  The distal femur was sized as above . Medial release was carried out. The anterior femoral cutting guide was aligned and centering hole made. The rotation guide was inserted and the anterior cutting guide pinned in place, and was in excellent alignment. The posterior femoral cuts were made. The flexion gap was established. The distal femoral cutting guide was introduced at 4 of valgus. This was pinned and the distal femoral cut made. The extension gap was established and was stable. The finishing guide was applied and finishing cuts made. The Mchale retractor was inserted and the keeled tibial trial was pinned in place. Centering hole was made and the keel inserted. The femoral component was inserted along with a polyethylene  insert and the knee articulated.  Extension and flexion showed good stability throughout. The patella was then sized and cut made for the patellar component. Centering holes were made. The trial was inserted and the knee articulated nicely with no need for lateral release. The trials were all removed and the knee thoroughly irrigated with pulsed lavage. Exparil was injected. The knee was dried and the cement mixed. The  keeled tibial component,  femoral component and patellar components were all cemented in place and excess cement was removed. The cement was allowed to harden for 10 minutes. Further irrigation was carried out. Bone wax was applied to all raw bony surfaces. Autovac drains were inserted. Quarter percent plain Marcaine, Toradol and morphine were injected. The capsule was closed with #2 Manson AllanQuill  suture, and the subcutaneous tissues were closed with 0 Quill suture. The skin was closed with staples.Sponge and needle counts were correct.   Aquacel dressing with TENS pads and a dry sterile dressing were applied. Polar Care and knee immobilizer were applied. Tourniquet was deflated with excellent return of blood flow to foot. Patient was transferred to a hospital bed and taken to the recovery room in good condition.  Valinda Hoar, MD

## 2017-03-25 NOTE — Anesthesia Post-op Follow-up Note (Cosign Needed)
Anesthesia QCDR form completed.        

## 2017-03-25 NOTE — Anesthesia Procedure Notes (Signed)
Spinal  Patient location during procedure: OR Start time: 03/25/2017 7:40 AM End time: 03/25/2017 7:45 AM Staffing Anesthesiologist: Martha Clan Resident/CRNA: Demetrius Charity Performed: resident/CRNA  Preanesthetic Checklist Completed: patient identified, site marked, surgical consent, pre-op evaluation, timeout performed, IV checked, risks and benefits discussed and monitors and equipment checked Spinal Block Patient position: sitting Prep: Betadine Patient monitoring: heart rate, continuous pulse ox, blood pressure and cardiac monitor Approach: midline Location: L3-4 Injection technique: single-shot Needle Needle type: Whitacre and Introducer  Needle gauge: 25 G Needle length: 9 cm Assessment Sensory level: T10 Additional Notes Negative paresthesia. Negative blood return. Positive free-flowing CSF. Expiration date of kit checked and confirmed. Patient tolerated procedure well, without complications.

## 2017-03-25 NOTE — H&P (Signed)
THE PATIENT WAS SEEN PRIOR TO SURGERY TODAY.  HISTORY, ALLERGIES, HOME MEDICATIONS AND OPERATIVE PROCEDURE WERE REVIEWED. RISKS AND BENEFITS OF SURGERY DISCUSSED WITH PATIENT AGAIN.  NO CHANGES FROM INITIAL HISTORY AND PHYSICAL NOTED.    

## 2017-03-25 NOTE — Anesthesia Procedure Notes (Signed)
Performed by: Aniket Paye Pre-anesthesia Checklist: Patient identified, Emergency Drugs available, Suction available, Patient being monitored and Timeout performed Oxygen Delivery Method: Simple face mask       

## 2017-03-25 NOTE — Anesthesia Preprocedure Evaluation (Signed)
Anesthesia Evaluation  Patient identified by MRN, date of birth, ID band Patient awake    Reviewed: Allergy & Precautions, H&P , NPO status , Patient's Chart, lab work & pertinent test results, reviewed documented beta blocker date and time   History of Anesthesia Complications Negative for: history of anesthetic complications  Airway Mallampati: III  TM Distance: >3 FB Neck ROM: full    Dental  (+) Edentulous Upper, Dental Advidsory Given, Missing, Poor Dentition   Pulmonary neg pulmonary ROS,           Cardiovascular Exercise Tolerance: Good negative cardio ROS       Neuro/Psych negative neurological ROS  negative psych ROS   GI/Hepatic Neg liver ROS, GERD  Controlled,  Endo/Other  diabetes (borderline)  Renal/GU negative Renal ROS  negative genitourinary   Musculoskeletal   Abdominal   Peds  Hematology negative hematology ROS (+)   Anesthesia Other Findings History reviewed. No pertinent past medical history.   Reproductive/Obstetrics negative OB ROS                             Anesthesia Physical Anesthesia Plan  ASA: II  Anesthesia Plan: Spinal   Post-op Pain Management:    Induction:   PONV Risk Score and Plan: 3 and Propofol and Ondansetron  Airway Management Planned:   Additional Equipment:   Intra-op Plan:   Post-operative Plan:   Informed Consent: I have reviewed the patients History and Physical, chart, labs and discussed the procedure including the risks, benefits and alternatives for the proposed anesthesia with the patient or authorized representative who has indicated his/her understanding and acceptance.   Dental Advisory Given  Plan Discussed with: Anesthesiologist, CRNA and Surgeon  Anesthesia Plan Comments:         Anesthesia Quick Evaluation

## 2017-03-25 NOTE — Evaluation (Signed)
Physical Therapy Evaluation Patient Details Name: ERCEL NORMOYLE MRN: [id number] DOB: December 15, 1945 Today's Date: 03/25/2017   History of Present Illness  admitted for acute hospitalization status post L TKR (03/25/17), PWB, KI with all mobility.  Clinical Impression  Upon evaluation, patient alert and oriented; follows all commands and demonstrates good insight/safety awareness. Increased time, frequent repetition required for comprehension and integration of new information.  L LE strength grossly 3-/5, limited by pain; ROM 8-80 degrees at this time.  Demonstrates ability to complete bed mobility, sit/stand, basic transfers and gait (5') with RW, min assist.  Constant cuing for adherence to PWB L LE. Patient very motivated; anticipate consistent progress towards all therapy goals. Would benefit from skilled PT to address above deficits and promote optimal return to PLOF; recommend transition to home with outpatient PT upon discharge.    Follow Up Recommendations Outpatient PT    Equipment Recommendations       Recommendations for Other Services       Precautions / Restrictions Precautions Precautions: Fall Required Braces or Orthoses: Knee Immobilizer - Left Restrictions Weight Bearing Restrictions: Yes LLE Weight Bearing: Partial weight bearing      Mobility  Bed Mobility Overal bed mobility: Needs Assistance Bed Mobility: Supine to Sit     Supine to sit: Min assist        Transfers Overall transfer level: Needs assistance Equipment used: Rolling walker (2 wheeled) Transfers: Sit to/from Stand Sit to Stand: Min assist         General transfer comment: cuing for hand placement and L LE WBing  Ambulation/Gait Ambulation/Gait assistance: Min assist Ambulation Distance (Feet): 5 Feet Assistive device: Rolling walker (2 wheeled)       General Gait Details: step to gait pattern, requiring step by step cuing for sequence and WBing awareness/adherence  Stairs            Wheelchair Mobility    Modified Rankin (Stroke Patients Only)       Balance Overall balance assessment: Needs assistance Sitting-balance support: No upper extremity supported;Feet supported Sitting balance-Leahy Scale: Good     Standing balance support: Bilateral upper extremity supported Standing balance-Leahy Scale: Fair                               Pertinent Vitals/Pain Pain Assessment: Faces Faces Pain Scale: Hurts little more Pain Location: L knee Pain Descriptors / Indicators: Aching;Grimacing Pain Intervention(s): Limited activity within patient's tolerance;Monitored during session;Premedicated before session;Repositioned    Home Living Family/patient expects to be discharged to:: Private residence Living Arrangements: Spouse/significant other Available Help at Discharge: Family;Available 24 hours/day Type of Home: House Home Access: Stairs to enter Entrance Stairs-Rails: None Entrance Stairs-Number of Steps: 2 Home Layout: One level Home Equipment: None      Prior Function Level of Independence: Independent         Comments: Indep with ADLs, household and community mobility.  Has been wearing neoprene 'braces' over bilat knees due to chronic arthritic pain     Hand Dominance        Extremity/Trunk Assessment   Upper Extremity Assessment Upper Extremity Assessment: Overall WFL for tasks assessed    Lower Extremity Assessment Lower Extremity Assessment:  (L knee grossly 3-/5, limited by pain; ROM 8-80 degrees.  Strength, ROM otherwise grossly WFL.  Sensation fully intact.)       Communication   Communication: No difficulties  Cognition Arousal/Alertness: Awake/alert Behavior During Therapy: [facility]  for tasks assessed/performed Overall Cognitive Status: Within Functional Limits for tasks assessed                                 General Comments: increased time, frequent repetition for understanding and  integration of new information      General Comments      Exercises Total Joint Exercises Goniometric ROM: 8-80 Other Exercises Other Exercises: Supine LE therex, 1x10, AROM: ankle pumps, quad sets, hip abduct/adduct, SLR and heel slides.   Assessment/Plan    PT Assessment Patient needs continued PT services  PT Problem List Decreased strength;Decreased range of motion;Decreased activity tolerance;Decreased balance;Decreased mobility;Decreased cognition;Decreased knowledge of use of DME;Decreased safety awareness;Decreased knowledge of precautions;Pain       PT Treatment Interventions DME instruction;Gait training;Stair training;Functional mobility training;Therapeutic activities;Therapeutic exercise;Balance training;Neuromuscular re-education;Patient/family education    PT Goals (Current goals can be found in the Care Plan section)  Acute Rehab PT Goals Patient Stated Goal: to do the best I can PT Goal Formulation: With patient Time For Goal Achievement: 04/08/17 Potential to Achieve Goals: Good    Frequency BID   Barriers to discharge        Co-evaluation               AM-PAC PT "6 Clicks" Daily Activity  Outcome Measure Difficulty turning over in bed (including adjusting bedclothes, sheets and blankets)?: Total Difficulty moving from lying on back to sitting on the side of the bed? : Total Difficulty sitting down on and standing up from a chair with arms (e.g., wheelchair, bedside commode, etc,.)?: Total Help needed moving to and from a bed to chair (including a wheelchair)?: A Little Help needed walking in hospital room?: A Little Help needed climbing 3-5 steps with a railing? : A Lot 6 Click Score: 11    End of Session Equipment Utilized During Treatment: Gait belt;Left knee immobilizer Activity Tolerance: Patient tolerated treatment well Patient left: in chair;with call bell/phone within reach (box missing from room; nursing to locate and connect to chair  alarm pad as available) Nurse Communication: Mobility status PT Visit Diagnosis: Difficulty in walking, not elsewhere classified (R26.2);Muscle weakness (generalized) (M62.81);Pain Pain - Right/Left: Left Pain - part of body: Knee    Time: [id number] PT Time Calculation (min) (ACUTE ONLY): 29 min   Charges:   PT Evaluation $PT Eval Low Complexity: 1 Procedure PT Treatments $Therapeutic Exercise: 8-22 mins   PT G Codes:        Annalisia Ingber H. Manson PasseyBrown, PT, DPT, NCS 03/25/17, 10:35 PM [phone]

## 2017-03-25 NOTE — [place] FL2 (Signed)
[place] MEDICAID FL2 LEVEL OF CARE SCREENING TOOL     IDENTIFICATION  Patient Name: Lindsey Choi Birthdate: March 19, 1946 Sex: female Admission Date (Current Location): 03/25/2017  [place]   Facility and Address:  [facility], [address], [place], [place] [address]      Provider Number: [id number]  Attending Physician Name and Address:  Deeann SaintMiller, Howard, MD  Relative Name and Phone Number:       Current Level of Care: Hospital Recommended Level of Care: Skilled Nursing Facility Prior Approval Number:    Date Approved/Denied:   PASRR Number:  [id number])  Discharge Plan: SNF    Current Diagnoses: Patient Active Problem List   Diagnosis Date Noted  . 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    Orientation RESPIRATION BLADDER Height & Weight     Self, Time, Situation, Place  Normal Continent Weight: 169 lb (76.7 kg) Height:  5\' 11"  (180.3 cm)  BEHAVIORAL SYMPTOMS/MOOD NEUROLOGICAL BOWEL NUTRITION STATUS   (none)  (none) Continent Diet (Regular Diet )  AMBULATORY STATUS COMMUNICATION OF NEEDS Skin   Extensive Assist Verbally Surgical wounds (Incision: Left Knee )                       Personal Care Assistance Level of Assistance  Bathing, Feeding, Dressing Bathing Assistance: Limited assistance Feeding assistance: Independent Dressing Assistance: Limited assistance     Functional Limitations Info  Sight, Hearing, Speech Sight Info: Adequate Hearing Info: Adequate Speech Info: Adequate    SPECIAL CARE FACTORS FREQUENCY  PT (By licensed PT), OT (By licensed OT)     PT Frequency:  (5) OT Frequency:  (5)            Contractures      Additional Factors Info  Code Status, Allergies Code Status Info:  (Full Code. ) Allergies Info:  (No Known Allergies. )           Current Medications (03/25/2017):  This is the current hospital active medication list Current Facility-Administered  Medications  Medication Dose Route Frequency Provider Last Rate Last Dose  . 0.45 % sodium chloride infusion   Intravenous Continuous Deeann SaintMiller, Howard, MD 75 mL/hr at 03/25/17 1358    . acetaminophen (TYLENOL) tablet 650 mg  650 mg Oral Q6H PRN Deeann SaintMiller, Howard, MD       Or  . acetaminophen (TYLENOL) suppository 650 mg  650 mg Rectal Q6H PRN Deeann SaintMiller, Howard, MD      . acetaminophen (TYLENOL) tablet 1,000 mg  1,000 mg Oral Q6H PRN Deeann SaintMiller, Howard, MD      . alum & mag hydroxide-simeth (MAALOX/MYLANTA) 200-200-20 MG/5ML suspension 30 mL  30 mL Oral Q4H PRN Deeann SaintMiller, Howard, MD      . atorvastatin (LIPITOR) tablet 10 mg  10 mg Oral q morning - 10a Deeann SaintMiller, Howard, MD      . bisacodyl (DULCOLAX) suppository 10 mg  10 mg Rectal Daily PRN Deeann SaintMiller, Howard, MD      . calcium-vitamin D (OSCAL WITH D) 500-200 MG-UNIT per tablet   Oral Daily Deeann SaintMiller, Howard, MD      . celecoxib (CELEBREX) capsule 200 mg  200 mg Oral Q12H Deeann SaintMiller, Howard, MD      . diphenhydrAMINE (BENADRYL) 12.5 MG/5ML elixir 12.5-25 mg  12.5-25 mg Oral Q4H PRN Deeann SaintMiller, Howard, MD      . Melene Muller[START ON 03/26/2017] enoxaparin (LOVENOX) injection 30 mg  30 mg Subcutaneous Q24H Deeann SaintMiller, Howard, MD      .  famotidine (PEPCID) tablet 40 mg  40 mg Oral BID PRN Deeann Saint, MD      . ferrous sulfate tablet 325 mg  325 mg Oral TID Sherilyn Cooter, MD      . gabapentin (NEURONTIN) capsule 300 mg  300 mg Oral TID Deeann Saint, MD      . HYDROcodone-acetaminophen [facility]) 7.5-325 MG per tablet 1-2 tablet  1-2 tablet Oral Q4H PRN Deeann Saint, MD      . magnesium hydroxide (MILK OF MAGNESIA) suspension 30 mL  30 mL Oral Daily PRN Deeann Saint, MD      . menthol-cetylpyridinium (CEPACOL) lozenge 3 mg  1 lozenge Oral PRN Deeann Saint, MD       Or  . phenol (CHLORASEPTIC) mouth spray 1 spray  1 spray Mouth/Throat PRN Deeann Saint, MD      . methocarbamol (ROBAXIN) tablet 500 mg  500 mg Oral Q6H PRN Deeann Saint, MD       Or  . methocarbamol (ROBAXIN) 500  mg in dextrose 5 % 50 mL IVPB  500 mg Intravenous Q6H PRN Deeann Saint, MD      . metoCLOPramide (REGLAN) tablet 5-10 mg  5-10 mg Oral Q8H PRN Deeann Saint, MD       Or  . metoCLOPramide (REGLAN) injection 5-10 mg  5-10 mg Intravenous Q8H PRN Deeann Saint, MD      . morphine 2 MG/ML injection 1 mg  1 mg Intravenous Q2H PRN Deeann Saint, MD      . omega-3 acid ethyl esters (LOVAZA) capsule 1 g  1 g Oral Daily Deeann Saint, MD      . ondansetron [facility]) 4 MG/2ML injection           . ondansetron (ZOFRAN) tablet 4 mg  4 mg Oral Q6H PRN Deeann Saint, MD       Or  . ondansetron [facility]) injection 4 mg  4 mg Intravenous Q6H PRN Deeann Saint, MD      . senna Mancel Parsons) tablet 8.6 mg  1 tablet Oral BID Deeann Saint, MD      . sodium phosphate (FLEET) 7-19 GM/118ML enema 1 enema  1 enema Rectal Once PRN Deeann Saint, MD      . vancomycin (VANCOCIN) IVPB 1000 mg/200 mL premix  1,000 mg Intravenous Q12H Deeann Saint, MD      . vitamin C (ASCORBIC ACID) tablet 500 mg  500 mg Oral BID Deeann Saint, MD      . zolpidem Remus Loffler) tablet 5 mg  5 mg Oral QHS PRN Deeann Saint, MD         Discharge Medications: Please see discharge summary for a list of discharge medications.  Relevant Imaging Results:  Relevant Lab Results:   Additional Information  (SSN: [id number])  Hortence Charter, Darleen Crocker, LCSW

## 2017-03-25 NOTE — Transfer of Care (Signed)
Immediate Anesthesia Transfer of Care Note  Patient: Lindsey Choi  Procedure(s) Performed: Procedure(s): TOTAL KNEE ARTHROPLASTY (Left)  Patient Location: PACU  Anesthesia Type:Spinal  Level of Consciousness: awake and alert   Airway & Oxygen Therapy: Patient Spontanous Breathing and Patient connected to face mask oxygen  Post-op Assessment: Report given to RN and Post -op Vital signs reviewed and stable  Post vital signs: Reviewed and stable  Last Vitals:  Vitals:   03/25/17 0615  BP: (!) 170/75  Pulse: 63  Resp: 16  Temp: 36.5 C    Last Pain:  Vitals:   03/25/17 0615  TempSrc: Oral  PainSc: 5          Complications: No apparent anesthesia complications

## 2017-03-25 NOTE — Progress Notes (Signed)
Pt ordered enoxaparin 30mg  daily. Pt crcl is >3530ml/min therefore, per protocol will increase dose to 40mg  daily starting tomorrow AM.  Olene FlossMelissa D Devonn Giampietro, Pharm.D, BCPS Clinical Pharmacist

## 2017-03-26 ENCOUNTER — Encounter: Payer: Self-pay | Admitting: Internal Medicine

## 2017-03-26 LAB — BASIC METABOLIC PANEL
ANION GAP: 2 — AB (ref 5–15)
BUN: 10 mg/dL (ref 6–20)
CALCIUM: 8.9 mg/dL (ref 8.9–10.3)
CHLORIDE: 110 mmol/L (ref 101–111)
CO2: 30 mmol/L (ref 22–32)
Creatinine, Ser: 0.83 mg/dL (ref 0.44–1.00)
GFR calc non Af Amer: >60 mL/min (ref >60)
Glucose, Bld: 118 mg/dL — ABNORMAL HIGH (ref 65–99)
Potassium: 3.7 mmol/L (ref 3.5–5.1)
SODIUM: 142 mmol/L (ref 135–145)

## 2017-03-26 LAB — CBC
HEMATOCRIT: 30.8 % — AB (ref 35.0–47.0)
HEMOGLOBIN: 10.5 g/dL — AB (ref 12.0–16.0)
MCH: 29.2 pg (ref 26.0–34.0)
MCHC: 34.2 g/dL (ref 32.0–36.0)
MCV: 85.4 fL (ref 80.0–100.0)
Platelets: 249 10*3/uL (ref 150–440)
RBC: 3.61 MIL/uL — ABNORMAL LOW (ref 3.80–5.20)
RDW: 13.6 % (ref 11.5–14.5)
WBC: 6.7 10*3/uL (ref 3.6–11.0)

## 2017-03-26 LAB — SURGICAL PATHOLOGY

## 2017-03-26 MED ORDER — BLISTEX MEDICATED EX OINT
TOPICAL_OINTMENT | CUTANEOUS | Status: DC | PRN
  Filled 2017-03-26: qty 6.3

## 2017-03-26 MED ORDER — HYDRALAZINE HCL 20 MG/ML IJ SOLN
10.0000 mg | Freq: Four times a day (QID) | INTRAMUSCULAR | Status: DC | PRN
  Administered 2017-03-26: 10 mg via INTRAVENOUS
  Filled 2017-03-26: qty 1

## 2017-03-26 NOTE — Progress Notes (Signed)
Subjective: 1 Day Post-Op Procedure(s) (LRB): TOTAL KNEE ARTHROPLASTY (Left)    Patient reports pain as none. Up with PT.  Drain removed.  BP running high so hospitalist consulted.  Plan D/C tomorrow  Objective:   VITALS:   Vitals:   03/26/17 0857 03/26/17 0933  BP: (!) 159/65 (!) 175/77  Pulse: (!) 57   Resp:    Temp:      Neurologically intact ABD soft Neurovascular intact Sensation intact distally Intact pulses distally Dorsiflexion/Plantar flexion intact Incision: no drainage  LABS  Recent Labs  03/25/17 1357 03/26/17 0440  HGB 11.6* 10.5*  HCT 33.7* 30.8*  WBC 8.2 6.7  PLT 283 249     Recent Labs  03/25/17 1357 03/26/17 0440  NA  --  142  K  --  3.7  BUN  --  10  CREATININE 0.81 0.83  GLUCOSE  --  118*    No results for input(s): LABPT, INR in the last 72 hours.   Assessment/Plan: 1 Day Post-Op Procedure(s) (LRB): TOTAL KNEE ARTHROPLASTY (Left)   Advance diet Up with therapy D/C IV fluids Discharge home with home health tomorrow

## 2017-03-26 NOTE — Progress Notes (Signed)
Clinical Social Worker (CSW) received SNF consult. PT is recommending outpatient PT. RN case manager aware of above. Please reconsult if future social work needs arise. CSW signing off.   Lakisa Lotz, LCSW [phone]  

## 2017-03-26 NOTE — Progress Notes (Signed)
BP elevated this morning. MD paged and order for hospitalist consult obtained.   Suzan SlickAlison L Amena Dockham, RN

## 2017-03-26 NOTE — Consult Note (Signed)
[employer] [facility] [place] [facility] Consultation  Lindsey Choi ZOX:[id number] DOB: [birth year] DOA: 03/25/2017 PCP: [facility]   Requesting physician: Dr. Hyacinth Meeker Date of consultation:  03/26/17 Reason for consultation: Elevated blood pressure  CHIEF COMPLAINT:  Elevated blood pressure  HISTORY OF PRESENT ILLNESS: Lindsey Choi  is a 71 y.o. female with a known history of  diabetes type 2, GERD, essential hypertension and hyperlipidemia who was taken off her medications according to her who underwent left knee arthroplasty. Postop patient is noted to have elevated blood pressure patient reports that she is not taking any blood pressure medications at home these were discontinued when she had episode of hypotension and acute renal failure. Patient reports that she does not have much pain. PAST MEDICAL HISTORY:   Past Medical History:  Diagnosis Date  . DMII (diabetes mellitus, type 2) (HCC)    taken off meds  . GERD (gastroesophageal reflux disease)   . Hyperlipemia    not on meds taken off  . Hypertension    not on meds take off    PAST SURGICAL HISTORY: Past Surgical History:  Procedure Laterality Date  . ABDOMINAL HYSTERECTOMY     partial 1987  . knees    . TOTAL KNEE ARTHROPLASTY Left 03/25/2017   Procedure: TOTAL KNEE ARTHROPLASTY;  Surgeon: Deeann Saint, MD;  Location: [facility] ORS;  Service: Orthopedics;  Laterality: Left;    SOCIAL HISTORY:  Social History  Substance Use Topics  . Smoking status: Never Smoker  . Smokeless tobacco: Never Used  . Alcohol use No    FAMILY HISTORY:  Family History  Problem Relation Age of Onset  . Diabetes Sister   . Diabetes Brother   . Breast cancer Neg Hx     DRUG ALLERGIES: No Known Allergies  REVIEW OF SYSTEMS:   CONSTITUTIONAL: No fever, fatigue or weakness.  EYES: No blurred or double vision.  EARS, NOSE, AND THROAT: No tinnitus or ear pain.  RESPIRATORY: No cough, shortness of breath,  wheezing or hemoptysis.  CARDIOVASCULAR: No chest pain, orthopnea, edema.  GASTROINTESTINAL: No nausea, vomiting, diarrhea or abdominal pain.  GENITOURINARY: No dysuria, hematuria.  ENDOCRINE: No polyuria, nocturia,  HEMATOLOGY: No anemia, easy bruising or bleeding SKIN: No rash or lesion. MUSCULOSKELETAL: No joint pain or arthritis.   NEUROLOGIC: No tingling, numbness, weakness.  PSYCHIATRY: No anxiety or depression.   MEDICATIONS AT HOME:  Prior to Admission medications   Medication Sig Start Date End Date Taking? Authorizing Provider  atorvastatin (LIPITOR) 10 MG tablet Take 10 mg by mouth every morning.    Yes [provider]  Calcium Carb-Cholecalciferol (CALCIUM 600 + D PO) Take 1 tablet by mouth daily.   Yes [provider]  cyclobenzaprine (FLEXERIL) 10 MG tablet Take 10 mg by mouth 3 (three) times daily as needed for muscle spasms.   Yes [provider]  famotidine (PEPCID) 40 MG tablet Take 40 mg by mouth 2 (two) times daily as needed for heartburn or indigestion.   Yes [provider]  Omega-3 Fatty Acids (FISH OIL) 1000 MG CAPS Take 2 capsules by mouth daily.   Yes [provider]  vitamin C (ASCORBIC ACID) 500 MG tablet Take 500 mg by mouth 2 (two) times daily.   Yes [provider]      PHYSICAL EXAMINATION:   VITAL SIGNS: Blood pressure 130/67, pulse 69, temperature 97.8 F (36.6 C), temperature source Oral, resp. rate 19, height 5\' 11"  (1.803 m), weight 169  lb (76.7 kg), SpO2 98 %.  GENERAL:  71 y.o.-year-old patient lying in the bed with no acute distress.  EYES: Pupils equal, round, reactive to light and accommodation. No scleral icterus. Extraocular muscles intact.  HEENT: Head atraumatic, normocephalic. Oropharynx and nasopharynx clear.  NECK:  Supple, no jugular venous distention. No thyroid enlargement, no tenderness.  LUNGS: Normal breath sounds bilaterally, no wheezing, rales,rhonchi or crepitation. No use  of accessory muscles of respiration.  CARDIOVASCULAR: S1, S2 normal. No murmurs, rubs, or gallops.  ABDOMEN: Soft, nontender, nondistended. Bowel sounds present. No organomegaly or mass.  EXTREMITIES: No pedal edema, cyanosis, or clubbing.  NEUROLOGIC: Cranial nerves II through XII are intact. Muscle strength 5/5 in all extremities. Sensation intact. Gait not checked.  PSYCHIATRIC: The patient is alert and oriented x 3.  SKIN: No obvious rash, lesion, or ulcer.   LABORATORY PANEL:   CBC  Recent Labs Lab 03/25/17 1357 03/26/17 0440  WBC 8.2 6.7  HGB 11.6* 10.5*  HCT 33.7* 30.8*  PLT 283 249  MCV 85.1 85.4  MCH 29.4 29.2  MCHC 34.5 34.2  RDW 13.3 13.6   ------------------------------------------------------------------------------------------------------------------  Chemistries   Recent Labs Lab 03/25/17 1357 03/26/17 0440  NA  --  142  K  --  3.7  CL  --  110  CO2  --  30  GLUCOSE  --  118*  BUN  --  10  CREATININE 0.81 0.83  CALCIUM  --  8.9   ------------------------------------------------------------------------------------------------------------------ estimated creatinine clearance is 69.5 mL/min (by C-G formula based on SCr of 0.83 mg/dL). ------------------------------------------------------------------------------------------------------------------ No results for input(s): TSH, T4TOTAL, T3FREE, THYROIDAB in the last 72 hours.  Invalid input(s): FREET3   Coagulation profile No results for input(s): INR, PROTIME in the last 168 hours. ------------------------------------------------------------------------------------------------------------------- No results for input(s): DDIMER in the last 72 hours. -------------------------------------------------------------------------------------------------------------------  Cardiac Enzymes No results for input(s): CKMB, TROPONINI, MYOGLOBIN in the last 168 hours.  Invalid input(s):  CK ------------------------------------------------------------------------------------------------------------------ Invalid input(s): POCBNP  ---------------------------------------------------------------------------------------------------------------  Urinalysis    Component Value Date/Time   COLORURINE STRAW (A) 03/11/2017 1306   APPEARANCEUR CLEAR (A) 03/11/2017 1306   LABSPEC 1.003 (L) 03/11/2017 1306   PHURINE 6.0 03/11/2017 1306   GLUCOSEU NEGATIVE 03/11/2017 1306   HGBUR NEGATIVE 03/11/2017 1306   BILIRUBINUR NEGATIVE 03/11/2017 1306   KETONESUR NEGATIVE 03/11/2017 1306   PROTEINUR NEGATIVE 03/11/2017 1306   NITRITE NEGATIVE 03/11/2017 1306   LEUKOCYTESUR SMALL (A) 03/11/2017 1306     RADIOLOGY: Dg Knee Left Port  Result Date: 03/25/2017 CLINICAL DATA:  Status post left total knee joint prosthesis placement. EXAM: PORTABLE LEFT KNEE - 1-2 VIEW COMPARISON:  None in PACs FINDINGS: The patient has undergone left total knee joint prosthesis placement. Positioning of the prosthetic components is good. The interface with the native bone appears normal. 2 surgical drain lines are present. IMPRESSION: There is no immediate postprocedure complication following left total knee joint prosthesis placement. Electronically Signed   By: David  [place]azil[place] M.D.   On: 03/25/2017 10:44    EKG: Orders placed or performed in visit on 01/14/08  . EKG 12-Lead    IMPRESSION AND PLAN: Patient is a 71 year old status post left knee arthroplasty now with elevated blood pressure  1. Elevated blood pressure and have given her a dose of hydralazine and her blood pressure significantly improved. Likely secondary to IV fluids and pain continue to monitor off any oral medications for now  2. GERD continue PPIs  3. Hyperlipidemia continue atorvastatin   4. Miscellaneous  recommend DVT prophylaxis All the records are reviewed and case discussed with ED provider. Management plans discussed with the  patient, family and they are in agreement.  CODE STATUS:    Code Status Orders        Start     Ordered   03/25/17 1033  Full code  Continuous     03/25/17 1032    Code Status History    Date Active Date Inactive Code Status Order ID Comments User Context   This patient has a current code status but no historical code status.       TOTAL TIME TAKING CARE OF THIS PATIENT: 50 minutes.    Auburn Bilberry M.D on 03/26/2017 at 1:27 PM  Between 7am to 6pm - Pager - [phone]  After 6pm go to [email or web address] - password [email or web address] [facility]  [place] [facility]  Office  [phone]  CC: Primary care physician; [facility]

## 2017-03-26 NOTE — Plan of Care (Signed)
Problem: Pain Managment: Goal: General experience of comfort will improve Outcome: Progressing Patient reports minimal pain and is only receiving tylenol for discomfort.

## 2017-03-26 NOTE — Progress Notes (Signed)
Physical Therapy Treatment Patient Details Name: Lindsey Choi MRN: [id number] DOB: Nov 08, 1945 Today's Date: 03/26/2017    History of Present Illness admitted for acute hospitalization status post L TKR (03/25/17), PWB, KI with all mobility.    PT Comments    Consistent progression in functional mobility and gait distance (220'), performing all mobility with no greater than close from therapist.  Excellent effort and participation throughout session. Per patient, also has entrance into front of home requiring only small threshold to negotiate (no formal steps); plans to utilize this entrance upon discharge home. Outpatient PT visit scheduled for 6/28; will issue HEP prior to discharge for use in the interim.    Follow Up Recommendations  Outpatient PT     Equipment Recommendations       Recommendations for Other Services       Precautions / Restrictions Precautions Precautions: Fall Required Braces or Orthoses: Knee Immobilizer - Left Restrictions Weight Bearing Restrictions: Yes LLE Weight Bearing: Partial weight bearing    Mobility  Bed Mobility Overal bed mobility: Modified Independent Bed Mobility: Supine to Sit;Sit to Supine           General bed mobility comments: able to negotiate LE on/off bed without difficulty  Transfers Overall transfer level: Needs assistance Equipment used: Rolling walker (2 wheeled) Transfers: Sit to/from Stand Sit to Stand: Supervision            Ambulation/Gait Ambulation/Gait assistance: Supervision Ambulation Distance (Feet): 220 Feet Assistive device: Rolling walker (2 wheeled)       General Gait Details: step to gait pattern with good adherence to PWB L LE   Stairs            Wheelchair Mobility    Modified Rankin (Stroke Patients Only)       Balance Overall balance assessment: Needs assistance Sitting-balance support: No upper extremity supported;Feet supported Sitting balance-Leahy Scale:  Normal     Standing balance support: Bilateral upper extremity supported Standing balance-Leahy Scale: Good                              Cognition Arousal/Alertness: Awake/alert Behavior During Therapy: WFL for tasks assessed/performed Overall Cognitive Status: Within Functional Limits for tasks assessed                                        Exercises Other Exercises Other Exercises: Toilet transfer, ambulatory with RW, close sup; sit/stand from standard height toilet, close sup with use of grab bar. Other Exercises: Verbally reviewed technique for car transfer; patient voiced understanding/agreement.    General Comments        Pertinent Vitals/Pain Pain Assessment: Faces Faces Pain Scale: Hurts a little bit Pain Location: L knee Pain Descriptors / Indicators: Sore;Aching Pain Intervention(s): Limited activity within patient's tolerance;Monitored during session;Repositioned    Home Living                      Prior Function            PT Goals (current goals can now be found in the care plan section) Acute Rehab PT Goals Patient Stated Goal: do whatever I need to do to get better PT Goal Formulation: With patient Time For Goal Achievement: 04/08/17 Potential to Achieve Goals: Good Progress towards PT goals: Progressing toward goals    Frequency  BID      PT Plan Current plan remains appropriate    Co-evaluation              AM-PAC PT "6 Clicks" Daily Activity  Outcome Measure  Difficulty turning over in bed (including adjusting bedclothes, sheets and blankets)?: None Difficulty moving from lying on back to sitting on the side of the bed? : None Difficulty sitting down on and standing up from a chair with arms (e.g., wheelchair, bedside commode, etc,.)?: A Little Help needed moving to and from a bed to chair (including a wheelchair)?: A Little Help needed walking in hospital room?: A Little Help needed  climbing 3-5 steps with a railing? : A Little 6 Click Score: 20    End of Session Equipment Utilized During Treatment: Gait belt;Left knee immobilizer Activity Tolerance: Patient tolerated treatment well Patient left: in bed;with call bell/phone within reach;with bed alarm set;with family/visitor present Nurse Communication: Mobility status PT Visit Diagnosis: Difficulty in walking, not elsewhere classified (R26.2);Muscle weakness (generalized) (M62.81);Pain Pain - Right/Left: Left Pain - part of body: Knee     Time: [id number] PT Time Calculation (min) (ACUTE ONLY): 21 min  Charges:  $Gait Training: 8-22 mins                    G Codes:       Nawaf Strange H. Manson PasseyBrown, PT, DPT, NCS 03/26/17, 5:14 PM [phone]

## 2017-03-26 NOTE — Care Management (Signed)
TC to Dr. Garen LahMillers office and let message for return call. RNCM need a prescription for OP PT for patient.

## 2017-03-26 NOTE — Care Management Note (Signed)
Case Management Note  Patient Details  Name: Lindsey PellegriniBarbara R Choi MRN: [id number] Date of Birth: December 03, 1945  Subjective/Objective:  Spoke with patient. She lives at home with spouse. She will need a walker. Ordered from [facility]. Patient will discharge home on Aspirin. [facility] [place]. Appointment scheduled for OP PT for June 28 at 11:30 am. Patient aware and agreeable. WILL NEED OP PT SCRIPT.                   Action/Plan: OP PT scheduled. Walker ordered from [facility]  Expected Discharge Date:                  Expected Discharge Plan:  OP Rehab  In-House Referral:     Discharge planning Services  CM Consult  Post Acute Care Choice:  Durable Medical Equipment Choice offered to:  Patient  DME Arranged:  Walker rolling DME Agency:  [facility]  [facility]     Status of Service:  In process, will continue to follow  If discussed at Long Length of Stay Meetings, dates discussed:    Additional Comments:  Marily MemosLisa M Jozee Hammer, RN 03/26/2017, 11:47 AM

## 2017-03-26 NOTE — Evaluation (Signed)
Occupational Therapy Evaluation Patient Details Name: Lindsey PellegriniBarbara R Choi MRN: [id number] DOB: Mar 02, 1946 Today's Date: 03/26/2017    History of Present Illness admitted for acute hospitalization status post L TKR (03/25/17), PWB, KI with all mobility.   Clinical Impression   Pt seen for OT evaluation this date. Pt was independent at baseline living with supportive spouse in 1 story home with 2 steps to enter w/ no hand rail and a tub shower with shower chair. Pt motivated to participate and eager to return to PLOF. Pt presents with elevated BP (189/69 and 159/65 before session, 175/77 during session), minimal pain in L knee, decreased strength/ROM in L knee, and decreased knowledge of precautions and AE/DME for self care tasks. Functional mobility training limited by elevated BP this am, hospitalist consult pending. Pt will benefit from skilled OT services to address noted impairments and functional deficits in order to maximize return to PLOF, including education/training in AE for self care skills, falls prevention, and functional mobility training for self care tasks.     Follow Up Recommendations  DC plan and follow up therapy as arranged by surgeon    Equipment Recommendations  3 in 1 bedside commode    Recommendations for Other Services       Precautions / Restrictions Precautions Precautions: Fall Required Braces or Orthoses: Knee Immobilizer - Left Restrictions Weight Bearing Restrictions: Yes LLE Weight Bearing: Partial weight bearing      Mobility Bed Mobility               General bed mobility comments: deferred d/t high BP, hospitalist consult pending and breakfast arrived  Transfers                 General transfer comment: deferred d/t high BP, hospitalist consult pending and breakfast arrived    Balance                                           ADL either performed or assessed with clinical judgement   ADL Overall ADL's : Needs  assistance/impaired Eating/Feeding: Sitting;Set up   Grooming: Sitting;Set up   Upper Body Bathing: Sitting;Set up;Supervision/ safety   Lower Body Bathing: Minimal assistance;Sitting/lateral leans   Upper Body Dressing : Sitting;Set up;Supervision/safety   Lower Body Dressing: Sitting/lateral leans;Sit to/from stand;Minimal assistance     Toilet Transfer Details (indicate cue type and reason): deferred d/t high BP, hospitalist consult pending         Functional mobility during ADLs:  (deferred d/t high BP, hospitalist consult pending) General ADL Comments: pt generally at min assist level with verbal cues for precautions, redirect and attend to tasks     Vision Baseline Vision/History: Wears glasses Wears Glasses: At all times Patient Visual Report: No change from baseline Vision Assessment?: No apparent visual deficits     Perception     Praxis      Pertinent Vitals/Pain Pain Assessment: 0-10 Pain Score: 2  Pain Location: L knee Pain Descriptors / Indicators: Aching Pain Intervention(s): Limited activity within patient's tolerance;Monitored during session;Premedicated before session     Hand Dominance Right   Extremity/Trunk Assessment Upper Extremity Assessment Upper Extremity Assessment: Overall WFL for tasks assessed   Lower Extremity Assessment Lower Extremity Assessment: Defer to PT evaluation;LLE deficits/detail       Communication Communication Communication: No difficulties   Cognition Arousal/Alertness: Awake/alert Behavior During Therapy: [facility] for tasks assessed/performed  Overall Cognitive Status: Within Functional Limits for tasks assessed                                 General Comments: Verbal cues to redirect to support focus    General Comments       Exercises     Shoulder Instructions      Home Living Family/patient expects to be discharged to:: Private residence Living Arrangements: Spouse/significant  other Available Help at Discharge: Family;Available 24 hours/day Type of Home: House Home Access: Stairs to enter [employer] of Steps: 2 Entrance Stairs-Rails: None Home Layout: One level     Bathroom Shower/Tub: IT trainer: Standard     Home Equipment: None          Prior Functioning/Environment Level of Independence: Independent        Comments: Indep with ADLs, household and community mobility.  Has been wearing neoprene 'braces' over bilat knees due to chronic arthritic pain        OT Problem List: Decreased strength;Decreased range of motion;Decreased activity tolerance;Decreased safety awareness;Decreased knowledge of use of DME or AE;Decreased knowledge of precautions      OT Treatment/Interventions: Self-care/ADL training;Therapeutic exercise;Therapeutic activities;Energy conservation;DME and/or AE instruction;Patient/family education    OT Goals(Current goals can be found in the care plan section) Acute Rehab OT Goals Patient Stated Goal: do whatever I need to do to get better OT Goal Formulation: With patient Time For Goal Achievement: 04/09/17 Potential to Achieve Goals: Good  OT Frequency: Min 1X/week   Barriers to D/C:            Co-evaluation              AM-PAC PT "6 Clicks" Daily Activity     Outcome Measure Help from another person eating meals?: None Help from another person taking care of personal grooming?: None Help from another person toileting, which includes using toliet, bedpan, or urinal?: A Little Help from another person bathing (including washing, rinsing, drying)?: A Little Help from another person to put on and taking off regular upper body clothing?: A Little Help from another person to put on and taking off regular lower body clothing?: A Little 6 Click Score: 20   End of Session CPM Left Knee CPM Left Knee: Off Nurse Communication: Other (comment) (BP 175/77)  Activity  Tolerance: Patient tolerated treatment well;Other (comment) (high BP limiting functional mobility this session) Patient left: in bed;with call bell/phone within reach;with bed alarm set;with nursing/sitter in room;Other (comment) (KI in place)  OT Visit Diagnosis: Other abnormalities of gait and mobility (R26.89)                Time: 7829-5621 OT Time Calculation (min): 25 min Charges:  OT General Charges $OT Visit: 1 Procedure OT Evaluation $OT Eval Low Complexity: 1 Procedure G-Codes:     Richrd Prime, MPH, MS, OTR/L ascom [phone] 03/26/17, 9:56 AM

## 2017-03-26 NOTE — Progress Notes (Signed)
Physical Therapy Treatment Patient Details Name: RAMINA HULET MRN: [id number] DOB: [birth year] Today's Date: 03/26/2017    History of Present Illness admitted for acute hospitalization status post L TKR (03/25/17), PWB, KI with all mobility.    PT Comments    Bed mobility with supervision, transfers and gait with min guard 130' with KI, PWB.  Progressing well with mobility skills with no LOB or buckling.  PT maintains PWB well with gait and turns.  To commode with min guard to void. Participated in exercises as described below.  Pt able to SLR x 10 without assist.  2-95 AAROM LLE.    Follow Up Recommendations  Outpatient PT     Equipment Recommendations       Recommendations for Other Services       Precautions / Restrictions Precautions Precautions: Fall Required Braces or Orthoses: Knee Immobilizer - Left Restrictions Weight Bearing Restrictions: Yes LLE Weight Bearing: Partial weight bearing    Mobility  Bed Mobility Overal bed mobility: Needs Assistance Bed Mobility: Supine to Sit;Sit to Supine     Supine to sit: Supervision Sit to supine: Supervision   General bed mobility comments: deferred d/t high BP, hospitalist consult pending and breakfast arrived  Transfers Overall transfer level: Needs assistance Equipment used: Rolling walker (2 wheeled) Transfers: Sit to/from Stand Sit to Stand: Min guard         General transfer comment: deferred d/t high BP, hospitalist consult pending and breakfast arrived  Ambulation/Gait Ambulation/Gait assistance: Min guard Ambulation Distance (Feet): 130 Feet Assistive device: Rolling walker (2 wheeled)     Gait velocity interpretation: Below normal speed for age/gender General Gait Details: step to pattern, good sequencing without LOB   Stairs            Wheelchair Mobility    Modified Rankin (Stroke Patients Only)       Balance Overall balance assessment: Needs assistance Sitting-balance support:  No upper extremity supported;Feet supported Sitting balance-Leahy Scale: Normal     Standing balance support: Bilateral upper extremity supported Standing balance-Leahy Scale: Good                              Cognition Arousal/Alertness: Awake/alert Behavior During Therapy: WFL for tasks assessed/performed Overall Cognitive Status: Within Functional Limits for tasks assessed                                 General Comments: Verbal cues to redirect to support focus       Exercises Total Joint Exercises Goniometric ROM: 2-95 Other Exercises Other Exercises: Supine LE therex, 1x10, AROM: ankle pumps, quad sets, hip abduct/adduct, SLR and heel slides. Other Exercises: seated LAQ and knee flexion    General Comments        Pertinent Vitals/Pain Pain Assessment: 0-10 Pain Score: 2  Pain Location: denies pain at rest.  some soreness after stretching. Pain Descriptors / Indicators: Sore Pain Intervention(s): Limited activity within patient's tolerance    Home Living Family/patient expects to be discharged to:: Private residence Living Arrangements: Spouse/significant other Available Help at Discharge: Family;Available 24 hours/day Type of Home: House Home Access: Stairs to enter Entrance Stairs-Rails: None Home Layout: One level Home Equipment: None      Prior Function Level of Independence: Independent      Comments: Indep with ADLs, household and community mobility.  Has been wearing neoprene 'braces' over  bilat knees due to chronic arthritic pain   PT Goals (current goals can now be found in the care plan section) Acute Rehab PT Goals Patient Stated Goal: do whatever I need to do to get better Progress towards PT goals: Progressing toward goals    Frequency    BID      PT Plan Current plan remains appropriate    Co-evaluation              AM-PAC PT "6 Clicks" Daily Activity  Outcome Measure  Difficulty turning over in  bed (including adjusting bedclothes, sheets and blankets)?: A Little Difficulty moving from lying on back to sitting on the side of the bed? : A Little Difficulty sitting down on and standing up from a chair with arms (e.g., wheelchair, bedside commode, etc,.)?: A Little Help needed moving to and from a bed to chair (including a wheelchair)?: A Little Help needed walking in hospital room?: A Little Help needed climbing 3-5 steps with a railing? : A Little 6 Click Score: 18    End of Session Equipment Utilized During Treatment: Gait belt;Left knee immobilizer Activity Tolerance: Patient tolerated treatment well Patient left: in chair;with call bell/phone within reach   Pain - Right/Left: Left Pain - part of body: Knee     Time: 1030-1102 PT Time Calculation (min) (ACUTE ONLY): 32 min  Charges:  $Gait Training: 8-22 mins $Therapeutic Exercise: 8-22 mins                    G Codes:       Danielle DessSarah Janai Maudlin, PTA 03/26/17, 11:18 AM

## 2017-03-26 NOTE — Anesthesia Postprocedure Evaluation (Signed)
Anesthesia Post Note  Patient: Lindsey Choi  Procedure(s) Performed: Procedure(s) (LRB): TOTAL KNEE ARTHROPLASTY (Left)  Patient location during evaluation: Nursing Unit Anesthesia Type: Spinal Level of consciousness: awake and alert and oriented Pain management: satisfactory to patient Vital Signs Assessment: post-procedure vital signs reviewed and stable Respiratory status: respiratory function stable Cardiovascular status: blood pressure returned to baseline and stable Postop Assessment: no headache, no backache, spinal receding, patient able to bend at knees, adequate PO intake and no signs of nausea or vomiting Anesthetic complications: no     Last Vitals:  Vitals:   03/25/17 2315 03/26/17 0526  BP: (!) 117/57 (!) 149/69  Pulse: (!) 54 (!) 51  Resp: 18 18  Temp: 36.4 C 36.5 C    Last Pain:  Vitals:   03/26/17 0526  TempSrc: Oral  PainSc:                  [name], [name]

## 2017-03-26 NOTE — Care Management (Addendum)
[facility] Updated her on Patient appointment for [facility] PT. [facility]

## 2017-03-27 LAB — CBC
HEMATOCRIT: 30.3 % — AB (ref 35.0–47.0)
Hemoglobin: 10.4 g/dL — ABNORMAL LOW (ref 12.0–16.0)
MCH: 29.2 pg (ref 26.0–34.0)
MCHC: 34.4 g/dL (ref 32.0–36.0)
MCV: 85 fL (ref 80.0–100.0)
Platelets: 255 10*3/uL (ref 150–440)
RBC: 3.56 MIL/uL — ABNORMAL LOW (ref 3.80–5.20)
RDW: 13.7 % (ref 11.5–14.5)
WBC: 9.8 10*3/uL (ref 3.6–11.0)

## 2017-03-27 MED ORDER — MELOXICAM 15 MG PO TABS: 15.0000 mg | ORAL_TABLET | Freq: Every day | ORAL | 3 refills | Status: DC

## 2017-03-27 MED ORDER — HYDROCODONE-ACETAMINOPHEN 5-325 MG PO TABS: 1.0000 | ORAL_TABLET | Freq: Four times a day (QID) | ORAL | 0 refills | Status: DC | PRN

## 2017-03-27 MED ORDER — GABAPENTIN 400 MG PO CAPS: 400.0000 mg | ORAL_CAPSULE | Freq: Two times a day (BID) | ORAL | 3 refills | Status: DC

## 2017-03-27 MED ORDER — ASPIRIN EC 325 MG PO TBEC: 325.0000 mg | DELAYED_RELEASE_TABLET | Freq: Two times a day (BID) | ORAL | 0 refills | Status: AC

## 2017-03-27 NOTE — Progress Notes (Signed)
Patient is being discharged home with husband.  Ted hose placed. Physical therapy with [facility].  Discharge and Rx instructions given and patient acknowledged understanding. IV removed. Belongings packed.  Notified NT to take patient to car.

## 2017-03-27 NOTE — Progress Notes (Signed)
Physical Therapy Treatment Patient Details Name: Lindsey Choi MRN: [id number] DOB: 12-19-1945 Today's Date: 03/27/2017    History of Present Illness admitted for acute hospitalization status post L TKR (03/25/17), PWB, KI with all mobility.    PT Comments    Patient with good progression towards all mobility goals; meeting all goals except stairs at this time (cga for safety and technique). Patient comfortable with functional abilities and upcoming discharge home; no additional questions/concerns at this time.    Follow Up Recommendations  Outpatient PT     Equipment Recommendations       Recommendations for Other Services       Precautions / Restrictions Precautions Precautions: Fall Required Braces or Orthoses: Knee Immobilizer - Left Restrictions LLE Weight Bearing: Partial weight bearing    Mobility  Bed Mobility Overal bed mobility: Modified Independent Bed Mobility: Supine to Sit;Sit to Supine              Transfers Overall transfer level: Needs assistance Equipment used: Rolling walker (2 wheeled) Transfers: Sit to/from Stand Sit to Stand: Supervision;Modified independent (Device/Increase time)            Ambulation/Gait Ambulation/Gait assistance: Supervision Ambulation Distance (Feet): 120 Feet (x2) Assistive device: Rolling walker (2 wheeled)       General Gait Details: step to gait pattern with good adherence to PWB L LE; steady cadence without buckling, LOB or safety concern.  Intermittent cuing to push vs lift RW   Stairs Stairs: Yes   Stair Management: No rails;With walker Number of Stairs: 1 (x2) General stair comments: min cuing for technique; slightly impulsive at times  Wheelchair Mobility    Modified Rankin (Stroke Patients Only)       Balance                                            Cognition Arousal/Alertness: Awake/alert Behavior During Therapy: WFL for tasks assessed/performed Overall  Cognitive Status: Within Functional Limits for tasks assessed                                 General Comments: increased time for task comprehension and processing      Exercises Total Joint Exercises Goniometric ROM: 2-100 Other Exercises Other Exercises: Supine LE therex, 1x12, AROM: ankle pumps, quad sets, SLR with eccentric lowering; seated therex, 1x8: knee flex/ext.  Good quad control and activation.  Educated in donning/doffing KI with patient returning demonstration; increased time to complete.    General Comments        Pertinent Vitals/Pain Pain Assessment: Faces Faces Pain Scale: Hurts a little bit Pain Location: L knee Pain Descriptors / Indicators: Sore;Aching Pain Intervention(s): Limited activity within patient's tolerance;Monitored during session;Repositioned    Home Living                      Prior Function            PT Goals (current goals can now be found in the care plan section) Acute Rehab PT Goals Patient Stated Goal: do whatever I need to do to get better PT Goal Formulation: With patient Time For Goal Achievement: 04/08/17 Potential to Achieve Goals: Good Progress towards PT goals: Progressing toward goals    Frequency    BID      PT  Plan Current plan remains appropriate    Co-evaluation              AM-PAC PT "6 Clicks" Daily Activity  Outcome Measure  Difficulty turning over in bed (including adjusting bedclothes, sheets and blankets)?: None Difficulty moving from lying on back to sitting on the side of the bed? : None Difficulty sitting down on and standing up from a chair with arms (e.g., wheelchair, bedside commode, etc,.)?: A Little Help needed moving to and from a bed to chair (including a wheelchair)?: A Little Help needed walking in hospital room?: A Little Help needed climbing 3-5 steps with a railing? : A Little 6 Click Score: 20    End of Session Equipment Utilized During Treatment: Gait  belt;Left knee immobilizer Activity Tolerance: Patient tolerated treatment well Patient left: in bed;with call bell/phone within reach;with bed alarm set;with family/visitor present Nurse Communication: Mobility status PT Visit Diagnosis: Difficulty in walking, not elsewhere classified (R26.2);Muscle weakness (generalized) (M62.81);Pain Pain - Right/Left: Left Pain - part of body: Knee     Time: 0932-1020 PT Time Calculation (min) (ACUTE ONLY): 48 min  Charges:  $Gait Training: 8-22 mins $Therapeutic Exercise: 8-22 mins $Therapeutic Activity: 8-22 mins                    G Codes:       Christien Frankl H. Manson PasseyBrown, PT, DPT, NCS 03/27/17, 10:30 AM [phone]

## 2017-03-27 NOTE — Discharge Summary (Signed)
Physician Discharge Summary  Patient ID: Lindsey PellegriniBarbara R Choi MRN: [id number] DOB/AGE: 72-Mar-1947 72 y.o.  Admit date: 03/25/2017 Discharge date: 03/27/2017  Admission Diagnoses:  Discharge Diagnoses:  Active Problems:   Total knee replacement status   Discharged Condition: good  Hospital Course: Left total knee on 03/25/17 without complication.  Seen by Hospitalist for transient  Hypertension. Patient had no pain and did well with PT.    Consults: Hospitalist  Significant Diagnostic Studies: radiology: X-Ray: satisfactory post op total hip  Treatments: antibiotics: vancomycin  Discharge Exam: Blood pressure 139/67, pulse 60, temperature 98.3 F (36.8 C), temperature source Oral, resp. rate 18, height 5\' 11"  (1.803 m), weight 76.7 kg (169 lb), SpO2 99 %.   Incision/Wound: Excellent  Disposition: Home with OPPT  Discharge Instructions    Call MD for:  persistant nausea and vomiting    Complete by:  As directed    Call MD for:  redness, tenderness, or signs of infection (pain, swelling, redness, odor or green/yellow discharge around incision site)    Complete by:  As directed    Call MD for:  severe uncontrolled pain    Complete by:  As directed    Call MD for:  temperature >100.4    Complete by:  As directed    Diet general    Complete by:  As directed    Discharge instructions    Complete by:  As directed    50% weight on left leg with walker Move knee every 2-3 hours RTC as appointed   Increase activity slowly    Complete by:  As directed    Leave dressing on - Keep it clean, dry, and intact until clinic visit    Complete by:  As directed      Allergies as of 03/27/2017   No Known Allergies     Medication List    TAKE these medications   aspirin EC 325 MG tablet Take 1 tablet (325 mg total) by mouth 2 (two) times daily.   atorvastatin 10 MG tablet Commonly known as:  LIPITOR Take 10 mg by mouth every morning.   CALCIUM 600 + D PO Take 1 tablet by mouth  daily.   cyclobenzaprine 10 MG tablet Commonly known as:  FLEXERIL Take 10 mg by mouth 3 (three) times daily as needed for muscle spasms.   famotidine 40 MG tablet Commonly known as:  PEPCID Take 40 mg by mouth 2 (two) times daily as needed for heartburn or indigestion.   Fish Oil 1000 MG Caps Take 2 capsules by mouth daily.   gabapentin 400 MG capsule Commonly known as:  NEURONTIN Take 1 capsule (400 mg total) by mouth 2 (two) times daily.   HYDROcodone-acetaminophen 5-325 MG tablet Commonly known as:  NORCO Take 1 tablet by mouth every 6 (six) hours as needed.   meloxicam 15 MG tablet Commonly known as:  MOBIC Take 1 tablet (15 mg total) by mouth daily.   vitamin C 500 MG tablet Commonly known as:  ASCORBIC ACID Take 500 mg by mouth 2 (two) times daily.            Durable Medical Equipment        Start     Ordered   03/27/17 1332  DME 3-in-1  Once     03/27/17 1333   03/25/17 1321  DME Walker rolling  Once    Question:  Patient needs a walker to treat with the following condition  Answer:  Total knee replacement  status   03/25/17 1320     Follow-up Information    [facility]. Go on 04/02/2017.   Why:  Physical Therapy appointment @ 11:30 am Contact information: [address] [place], [place] [address] [phone]          Signed: Valinda Hoar 03/27/2017, 1:34 PM

## 2017-03-27 NOTE — Care Management (Signed)
Patient needed a bsc. Ordered from advanced and has been delivered to patient. Met with patient and her spouse at bedside again to answer discharge questions. All concerns addressed.

## 2017-03-27 NOTE — Progress Notes (Signed)
Subjective: 2 Days Post-Op Procedure(s) (LRB): TOTAL KNEE ARTHROPLASTY (Left)    Patient reports pain as none.  Objective:  Doing well.  CSM good left leg..  Dressing changed.  Wound benign.    VITALS:   Vitals:   03/26/17 2333 03/27/17 0743  BP: (!) 145/70 139/67  Pulse: 63 60  Resp: 19 18  Temp: 97.9 F (36.6 C) 98.3 F (36.8 C)    Neurologically intact ABD soft Neurovascular intact Sensation intact distally Intact pulses distally Dorsiflexion/Plantar flexion intact Incision: no drainage  LABS  Recent Labs  03/25/17 1357 03/26/17 0440 03/27/17 0433  HGB 11.6* 10.5* 10.4*  HCT 33.7* 30.8* 30.3*  WBC 8.2 6.7 9.8  PLT 283 249 255     Recent Labs  03/25/17 1357 03/26/17 0440  NA  --  142  K  --  3.7  BUN  --  10  CREATININE 0.81 0.83  GLUCOSE  --  118*    No results for input(s): LABPT, INR in the last 72 hours.   Assessment/Plan: 2 Days Post-Op Procedure(s) (LRB): TOTAL KNEE ARTHROPLASTY (Left)   D/C home today Start OPPT Monday EC asa bid

## 2017-04-07 ENCOUNTER — Encounter: Payer: Self-pay | Admitting: Specialist

## 2017-12-21 ENCOUNTER — Other Ambulatory Visit: Payer: Self-pay | Admitting: Specialist

## 2017-12-21 DIAGNOSIS — Z1231 Encounter for screening mammogram for malignant neoplasm of breast: Secondary | ICD-10-CM

## 2018-01-25 ENCOUNTER — Ambulatory Visit
Admission: RE | Admit: 2018-01-25 | Discharge: 2018-01-25 | Disposition: A | Discharge status: Home / Self Care | Payer: Medicare Other | Source: Ambulatory Visit | Attending: Specialist | Admitting: Specialist

## 2018-01-25 DIAGNOSIS — R928 Other abnormal and inconclusive findings on diagnostic imaging of breast: Secondary | ICD-10-CM | POA: Insufficient documentation

## 2018-01-25 DIAGNOSIS — Z1231 Encounter for screening mammogram for malignant neoplasm of breast: Secondary | ICD-10-CM | POA: Insufficient documentation

## 2018-01-26 ENCOUNTER — Other Ambulatory Visit: Payer: Self-pay | Admitting: Specialist

## 2018-01-26 DIAGNOSIS — R928 Other abnormal and inconclusive findings on diagnostic imaging of breast: Secondary | ICD-10-CM

## 2018-01-26 DIAGNOSIS — N6310 Unspecified lump in the right breast, unspecified quadrant: Secondary | ICD-10-CM

## 2018-02-03 ENCOUNTER — Ambulatory Visit
Admission: RE | Admit: 2018-02-03 | Discharge: 2018-02-03 | Disposition: A | Discharge status: Home / Self Care | Payer: Medicare Other | Source: Ambulatory Visit | Attending: Specialist | Admitting: Specialist

## 2018-02-03 DIAGNOSIS — R928 Other abnormal and inconclusive findings on diagnostic imaging of breast: Secondary | ICD-10-CM

## 2018-02-03 DIAGNOSIS — N6311 Unspecified lump in the right breast, upper outer quadrant: Secondary | ICD-10-CM | POA: Diagnosis not present

## 2018-02-03 DIAGNOSIS — N6310 Unspecified lump in the right breast, unspecified quadrant: Secondary | ICD-10-CM | POA: Diagnosis present

## 2018-02-08 ENCOUNTER — Other Ambulatory Visit: Payer: Self-pay | Admitting: Specialist

## 2018-02-08 DIAGNOSIS — R928 Other abnormal and inconclusive findings on diagnostic imaging of breast: Secondary | ICD-10-CM

## 2018-02-08 DIAGNOSIS — N6310 Unspecified lump in the right breast, unspecified quadrant: Secondary | ICD-10-CM

## 2018-02-16 ENCOUNTER — Ambulatory Visit
Admission: RE | Admit: 2018-02-16 | Discharge: 2018-02-16 | Disposition: A | Discharge status: Home / Self Care | Payer: Medicare Other | Source: Ambulatory Visit | Attending: Specialist | Admitting: Specialist

## 2018-02-16 DIAGNOSIS — N6310 Unspecified lump in the right breast, unspecified quadrant: Secondary | ICD-10-CM

## 2018-02-16 DIAGNOSIS — R928 Other abnormal and inconclusive findings on diagnostic imaging of breast: Secondary | ICD-10-CM

## 2018-02-16 DIAGNOSIS — N6001 Solitary cyst of right breast: Secondary | ICD-10-CM | POA: Diagnosis not present

## 2018-02-16 HISTORY — PX: BREAST BIOPSY: SHX20

## 2018-02-17 LAB — SURGICAL PATHOLOGY

## 2018-05-05 ENCOUNTER — Other Ambulatory Visit: Payer: Self-pay | Admitting: Specialist

## 2018-05-12 ENCOUNTER — Other Ambulatory Visit: Payer: Self-pay

## 2018-05-12 ENCOUNTER — Encounter
Admission: RE | Admit: 2018-05-12 | Discharge: 2018-05-12 | Disposition: A | Discharge status: Home / Self Care | Payer: Medicare Other | Source: Ambulatory Visit | Attending: Specialist | Admitting: Specialist

## 2018-05-12 DIAGNOSIS — Z01810 Encounter for preprocedural cardiovascular examination: Secondary | ICD-10-CM | POA: Diagnosis present

## 2018-05-12 DIAGNOSIS — Z01812 Encounter for preprocedural laboratory examination: Secondary | ICD-10-CM | POA: Diagnosis not present

## 2018-05-12 DIAGNOSIS — R9431 Abnormal electrocardiogram [ECG] [EKG]: Secondary | ICD-10-CM | POA: Diagnosis not present

## 2018-05-12 LAB — COMPREHENSIVE METABOLIC PANEL
ALBUMIN: 4.4 g/dL (ref 3.5–5.0)
ALK PHOS: 41 U/L (ref 38–126)
ALT: 17 U/L (ref 0–44)
ANION GAP: 8 (ref 5–15)
AST: 17 U/L (ref 15–41)
BUN: 14 mg/dL (ref 8–23)
CALCIUM: 9.1 mg/dL (ref 8.9–10.3)
CO2: 28 mmol/L (ref 22–32)
Chloride: 105 mmol/L (ref 98–111)
Creatinine, Ser: 1.06 mg/dL — ABNORMAL HIGH (ref 0.44–1.00)
GFR calc non Af Amer: 51 mL/min — ABNORMAL LOW (ref >60)
GFR, EST AFRICAN AMERICAN: 59 mL/min — AB (ref >60)
GLUCOSE: 113 mg/dL — AB (ref 70–99)
POTASSIUM: 3.8 mmol/L (ref 3.5–5.1)
SODIUM: 141 mmol/L (ref 135–145)
TOTAL PROTEIN: 7.4 g/dL (ref 6.5–8.1)
Total Bilirubin: 1 mg/dL (ref 0.3–1.2)

## 2018-05-12 LAB — CBC WITH DIFFERENTIAL/PLATELET
BASOS PCT: 1 %
Basophils Absolute: 0.1 10*3/uL (ref 0–0.1)
EOS ABS: 0.1 10*3/uL (ref 0–0.7)
EOS PCT: 1 %
HCT: 36.4 % (ref 35.0–47.0)
HEMOGLOBIN: 12.5 g/dL (ref 12.0–16.0)
LYMPHS ABS: 1.5 10*3/uL (ref 1.0–3.6)
Lymphocytes Relative: 28 %
MCH: 29.1 pg (ref 26.0–34.0)
MCHC: 34.2 g/dL (ref 32.0–36.0)
MCV: 84.9 fL (ref 80.0–100.0)
MONO ABS: 0.3 10*3/uL (ref 0.2–0.9)
MONOS PCT: 6 %
Neutro Abs: 3.4 10*3/uL (ref 1.4–6.5)
Neutrophils Relative %: 64 %
Platelets: 294 10*3/uL (ref 150–440)
RBC: 4.29 MIL/uL (ref 3.80–5.20)
RDW: 13.9 % (ref 11.5–14.5)
WBC: 5.3 10*3/uL (ref 3.6–11.0)

## 2018-05-12 LAB — URINALYSIS, ROUTINE W REFLEX MICROSCOPIC
Bilirubin Urine: NEGATIVE
GLUCOSE, UA: NEGATIVE mg/dL
HGB URINE DIPSTICK: NEGATIVE
KETONES UR: NEGATIVE mg/dL
NITRITE: NEGATIVE
PROTEIN: NEGATIVE mg/dL
Specific Gravity, Urine: 1.023 (ref 1.005–1.030)
pH: 5 (ref 5.0–8.0)

## 2018-05-12 LAB — TYPE AND SCREEN
ABO/RH(D): O POS
Antibody Screen: NEGATIVE

## 2018-05-12 LAB — SURGICAL PCR SCREEN
MRSA, PCR: NEGATIVE
Staphylococcus aureus: NEGATIVE

## 2018-05-12 LAB — HEMOGLOBIN A1C
Hgb A1c MFr Bld: 6.3 % — ABNORMAL HIGH (ref 4.8–5.6)
MEAN PLASMA GLUCOSE: 134.11 mg/dL

## 2018-05-12 LAB — PROTIME-INR
INR: 0.93
PROTHROMBIN TIME: 12.4 s (ref 11.4–15.2)

## 2018-05-12 NOTE — Patient Instructions (Signed)
Your procedure is scheduled on: Monday 05/17/18  Report to DAY SURGERY DEPARTMENT LOCATED ON 2ND FLOOR MEDICAL MALL ENTRANCE. To find out your arrival time please call [phone] between 1PM - 3PM on Friday 05/14/18  Remember: Instructions that are not followed completely may result in serious medical risk, up to and including death, or upon the discretion of your surgeon and anesthesiologist your surgery may need to be rescheduled.     _X__ 1. Do not eat food after midnight the night before your procedure.                 No gum chewing or hard candies. You may drink clear liquids up to 2 hours                 before you are scheduled to arrive for your surgery- DO not drink clear                 liquids within 2 hours of the start of your surgery.                 Clear Liquids include:  water, apple juice without pulp, clear carbohydrate                 drink such as Clearfast or Gatorade, Black Coffee or Tea (Do not add                 anything to coffee or tea).  __X__2.  On the morning of surgery brush your teeth with toothpaste and water, you may rinse your mouth with mouthwash if you wish.  Do not swallow any toothpaste of mouthwash.     _X__ 3.  No Alcohol for 24 hours before or after surgery.   _X__ 4.  Do Not Smoke or use e-cigarettes For 24 Hours Prior to Your Surgery.                 Do not use any chewable tobacco products for at least 6 hours prior to                 surgery.  ____  5.  Bring all medications with you on the day of surgery if instructed.   __X__  6.  Notify your doctor if there is any change in your medical condition      (cold, fever, infections).     Do not wear jewelry, make-up, hairpins, clips or nail polish. Do not wear lotions, powders, or perfumes.  Do not shave 48 hours prior to surgery. Men may shave face and neck. Do not bring valuables to the hospital.    [facility] is not responsible for any belongings or valuables.  Contacts,  dentures/partials or body piercings may not be worn into surgery. Bring a case for your contacts, glasses or hearing aids, a denture cup will be supplied. Leave your suitcase in the car. After surgery it may be brought to your room. For patients admitted to the hospital, discharge time is determined by your treatment team.   Patients discharged the day of surgery will not be allowed to drive home.   Please read over the following fact sheets that you were given:   MRSA Information  __X__ Take these medicines the morning of surgery with A SIP OF WATER:     1. atorvastatin (LIPITOR) 10 MG tablet  2. cyclobenzaprine (FLEXERIL) 10 MG tablet IF NEEDED  3. famotidine (PEPCID) 40 MG tablet  4.  5.  6.  ____ Fleet Enema (as directed)   __X__ Use CHG Soap/SAGE wipes as directed  ____ Use inhalers on the day of surgery. Also bring the inhaler with you to the hospital on the morning of surgery.  ____ Stop metformin/Janumet/Farxiga 2 days prior to surgery    ____ Take 1/2 of usual insulin dose the night before surgery. No insulin the morning          of surgery.   ____ Stop Blood Thinners Coumadin/Plavix/Xarelto/Pleta/Pradaxa/Eliquis/Effient/Aspirin   __X__ Stop Anti-inflammatories 7 days before surgery such as Advil, Ibuprofen, Motrin, BC or Goodies Powder, Naprosyn, Naproxen, Aleve, Aspirin, Meloxicam. May take Tylenol if needed for pain or discomfort.   __X__ Stop all herbal supplements, fish oil or vitamin E until after surgery.   ____ Bring C-Pap to the hospital.

## 2018-05-12 NOTE — Pre-Procedure Instructions (Signed)
Faxed UA results to Dr. Hyacinth MeekerMiller. Fax confirmation received "OK".

## 2018-05-12 NOTE — Pre-Procedure Instructions (Signed)
EKG'S REVIEWED BY DR KARENZ AND OK TO PROCEED 

## 2018-05-17 ENCOUNTER — Ambulatory Visit: Admission: RE | Admit: 2018-05-17 | Payer: Medicare Other | Source: Ambulatory Visit | Admitting: Specialist

## 2018-05-17 ENCOUNTER — Encounter: Admission: RE | Payer: Self-pay | Source: Ambulatory Visit

## 2018-05-17 SURGERY — ARTHROPLASTY, KNEE, TOTAL
Anesthesia: Choice | Laterality: Right

## 2018-12-21 ENCOUNTER — Other Ambulatory Visit: Payer: Self-pay

## 2018-12-21 ENCOUNTER — Encounter: Payer: Self-pay | Admitting: Emergency Medicine

## 2018-12-21 ENCOUNTER — Ambulatory Visit
Admission: EM | Admit: 2018-12-21 | Discharge: 2018-12-21 | Disposition: A | Discharge status: Home / Self Care | Payer: Medicare Other | Attending: Family Medicine | Admitting: Family Medicine

## 2018-12-21 DIAGNOSIS — Z711 Person with feared health complaint in whom no diagnosis is made: Secondary | ICD-10-CM | POA: Diagnosis not present

## 2018-12-21 DIAGNOSIS — K5900 Constipation, unspecified: Secondary | ICD-10-CM

## 2018-12-21 MED ORDER — POLYETHYLENE GLYCOL 3350 17 GM/SCOOP PO POWD: ORAL | 0 refills | Status: AC

## 2018-12-21 NOTE — Discharge Instructions (Signed)
Miralax for constipation.  There is no evidence of coronavirus.  Please follow up with her primary care physician.  Take care  Dr. Adriana Simas

## 2018-12-21 NOTE — ED Provider Notes (Signed)
[facility]    CSN: [id number] Arrival date & time: 12/21/18  0806  History   Chief Complaint Chief Complaint  Patient presents with  . Constipation  . Cough   HPI  73 year old female presents for evaluation today.  Both her and her husband are very poor historians.  He is very difficult to discern why exactly they are here.  Patient does state that she has had trouble having a bowel movement for the past 3 to 4 days.  She looks to her husband for all answers to the questions.  Husband states that she is not her normal self.  He states that she is disagreeing with him often.  He states that this is not her usual.  Also states that she often gets her daughter mixed up.  Because 1 by the wrong name.  She feels "hot".  She currently feels well.  She has no fever.  She endorsed cough to my nursing staff.  Patient also endorses dry mouth.  Patient also states that she is worried that she is going to get coronavirus.  Both her and her husband are difficult to follow.  PMH, Surgical Hx, Family Hx, Social History reviewed and updated as below.  Past Medical History:  Diagnosis Date  . DMII (diabetes mellitus, type 2) (HCC)    taken off meds  . GERD (gastroesophageal reflux disease)   . Hyperlipemia    not on meds taken off  . Hypertension    not on meds take off    Patient Active Problem List   Diagnosis Date Noted  . 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 ABDOMINAL HYSTERECTOMY     partial 1987  . BREAST BIOPSY Right 02/16/2018   pending path  . JOINT REPLACEMENT    . knees    . TOTAL KNEE ARTHROPLASTY Left 03/25/2017   Procedure: TOTAL KNEE ARTHROPLASTY;  Surgeon: Deeann Saint, MD;  Location: [facility] ORS;  Service: Orthopedics;  Laterality: Left;    OB History   No obstetric history on file.      Home Medications    Prior to Admission medications   Medication Sig Start Date End Date Taking?  Authorizing Provider  aspirin EC 325 MG tablet Take 1 tablet (325 mg total) by mouth 2 (two) times daily. Patient taking differently: Take 81 mg by mouth daily.  03/27/17  Yes Deeann Saint, MD  atorvastatin (LIPITOR) 10 MG tablet Take 10 mg by mouth every morning.    Yes [provider]  Calcium Carb-Cholecalciferol (CALCIUM 600 + D PO) Take 1 tablet by mouth daily.   Yes [provider]  Multiple Vitamin (MULTIVITAMIN WITH MINERALS) TABS tablet Take 1 tablet by mouth daily.   Yes [provider]  Omega-3 Fatty Acids (FISH OIL) 1000 MG CAPS Take 1,000 mg by mouth daily.   Yes [provider]  polyethylene glycol powder (GLYCOLAX/MIRALAX) powder 17 g once or twice daily for constipation. 12/21/18   Tommie Sams, DO    Family History Family History  Problem Relation Age of Onset  . Diabetes Sister   . Diabetes Brother   . Breast cancer Neg Hx     Social History Social History   Tobacco Use  . Smoking status: Never Smoker  . Smokeless tobacco: Never Used  Substance Use Topics  . Alcohol use: No  . Drug use: No     Allergies   Patient has no known  allergies.   Review of Systems Review of Systems Per HPI  Physical Exam Triage Vital Signs ED Triage Vitals  Enc Vitals Group     BP 12/21/18 0821 (!) 144/77     Pulse Rate 12/21/18 0821 74     Resp 12/21/18 0821 14     Temp 12/21/18 0821 98.1 F (36.7 C)     Temp Source 12/21/18 0821 Oral     SpO2 12/21/18 0821 99 %     Weight 12/21/18 0817 160 lb (72.6 kg)     Height 12/21/18 0817 5\' 11"  (1.803 m)     Head Circumference --      Peak Flow --      Pain Score 12/21/18 0817 0     Pain Loc --      Pain Edu? --      Excl. in [place]? --    Updated Vital Signs BP (!) 144/77 (BP Location: Left Arm)   Pulse 74   Temp 98.1 F (36.7 C) (Oral)   Resp 14   Ht 5\' 11"  (1.803 m)   Wt 72.6 kg   SpO2 99%   BMI 22.32 kg/m   Visual Acuity Right Eye Distance:   Left Eye Distance:   Bilateral  Distance:    Right Eye Near:   Left Eye Near:    Bilateral Near:     Physical Exam Vitals signs and nursing note reviewed.  Constitutional:      General: She is not in acute distress.    Appearance: Normal appearance.  HENT:     Head: Normocephalic and atraumatic.     Mouth/Throat:     Mouth: Mucous membranes are moist.     Pharynx: Oropharynx is clear.  Cardiovascular:     Rate and Rhythm: Normal rate and regular rhythm.  Pulmonary:     Effort: Pulmonary effort is normal.     Breath sounds: Normal breath sounds.  Abdominal:     General: There is no distension.     Palpations: Abdomen is soft.     Tenderness: There is no abdominal tenderness.  Neurological:     Mental Status: She is alert.  Psychiatric:        Behavior: Behavior normal.     Comments: Anxious.    UC Treatments / Results  Labs (all labs ordered are listed, but only abnormal results are displayed) Labs Reviewed  URINALYSIS, COMPLETE (UACMP) WITH MICROSCOPIC    EKG None  Radiology No results found.  Procedures Procedures (including critical care time)  Medications Ordered in UC Medications - No data to display  Initial Impression / Assessment and Plan / UC Course  I have reviewed the triage vital signs and the nursing notes.  Pertinent labs & imaging results that were available during my care of the patient were reviewed by me and considered in my medical decision making (see chart for details).    73 year old female presents today for evaluation.  Patient and her husband appear to be worried well.  I believe that they are predominantly concerned about the current coronavirus pandemic.  She has a normal exam.  Vital signs stable.  She has no evidence of coronavirus or influenza.  She is endorsing constipation.  Placing on MiraLAX.  Follow-up with her primary care physician.  Final Clinical Impressions(s) / UC Diagnoses   Final diagnoses:  Worried well  Constipation, unspecified constipation  type     Discharge Instructions     Miralax for constipation.  There is  no evidence of coronavirus.  Please follow up with her primary care physician.  Take care  Dr. Adriana Simas    ED Prescriptions    Medication Sig Dispense Auth. Provider   polyethylene glycol powder (GLYCOLAX/MIRALAX) powder 17 g once or twice daily for constipation. 500 g Tommie Sams, DO     Controlled Substance Prescriptions [place] Controlled Substance Registry consulted? Not Applicable   Tommie Sams, DO 12/21/18 0840

## 2018-12-21 NOTE — ED Triage Notes (Signed)
Patient states that she has not had a bowel movement in 3-4 days.  Patient reports slight cough.  Patient denies fevers.

## 2019-02-24 ENCOUNTER — Other Ambulatory Visit: Payer: Self-pay | Admitting: Neurology

## 2019-02-24 DIAGNOSIS — F05 Delirium due to known physiological condition: Secondary | ICD-10-CM

## 2019-03-05 ENCOUNTER — Ambulatory Visit
Admission: RE | Admit: 2019-03-05 | Discharge: 2019-03-05 | Disposition: A | Discharge status: Home / Self Care | Payer: Medicare Other | Source: Ambulatory Visit | Attending: Neurology | Admitting: Neurology

## 2019-03-05 ENCOUNTER — Other Ambulatory Visit: Payer: Self-pay

## 2019-03-05 DIAGNOSIS — F05 Delirium due to known physiological condition: Secondary | ICD-10-CM | POA: Diagnosis present

## 2019-03-05 LAB — POCT I-STAT CREATININE: Creatinine, Ser: 0.9 mg/dL (ref 0.44–1.00)

## 2019-03-05 MED ORDER — GADOBUTROL 1 MMOL/ML IV SOLN
7.0000 mL | Freq: Once | INTRAVENOUS | Status: AC | PRN
  Administered 2019-03-05: 7 mL via INTRAVENOUS

## 2019-03-28 ENCOUNTER — Other Ambulatory Visit: Payer: Self-pay | Admitting: Specialist

## 2019-03-28 DIAGNOSIS — Z1231 Encounter for screening mammogram for malignant neoplasm of breast: Secondary | ICD-10-CM

## 2019-04-14 ENCOUNTER — Ambulatory Visit
Admission: RE | Admit: 2019-04-14 | Discharge: 2019-04-14 | Disposition: A | Discharge status: Home / Self Care | Payer: Medicare Other | Source: Ambulatory Visit | Attending: Specialist | Admitting: Specialist

## 2019-04-14 ENCOUNTER — Other Ambulatory Visit: Payer: Self-pay

## 2019-04-14 DIAGNOSIS — Z1231 Encounter for screening mammogram for malignant neoplasm of breast: Secondary | ICD-10-CM | POA: Diagnosis not present

## 2019-06-06 IMAGING — MR MRI HEAD WITHOUT AND WITH CONTRAST
13 of 14 series · 39 of 48 positions shown · IV contrast (gadavist)
Comparison: None.

CLINICAL DATA: Delirium due to known physiological condition. On
and off memory loss and confusion.

EXAM:
MRI HEAD WITHOUT AND WITH CONTRAST
TECHNIQUE: Multiplanar, multiecho pulse sequences of the brain and surrounding
structures were obtained without and with intravenous contrast.
CONTRAST:  7 cc Gadavist intravenous

[Series 5: ax dwi_tracew · axial · 3.0mm · 0.60mm/px · z∈[-78,+78]mm · 6 of 55 slices shown]
[im 1/55]
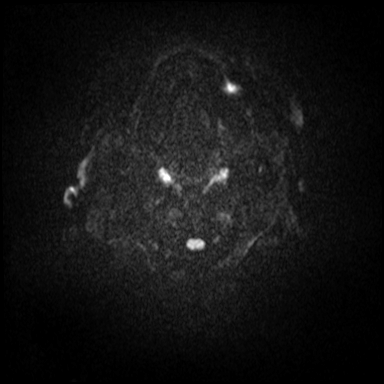
[im 11/55]
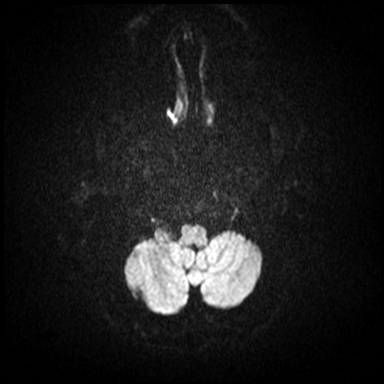
[im 22/55]
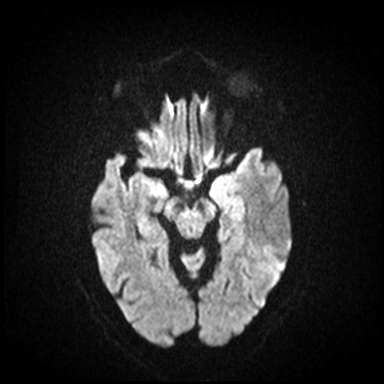
[im 33/55]
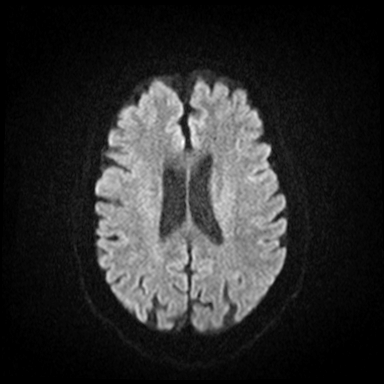
[im 44/55]
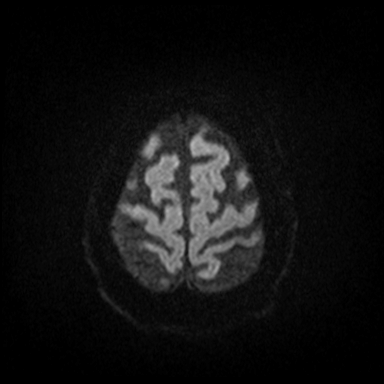
[im 55/55]
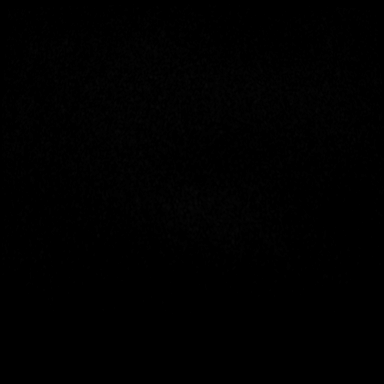

[Series 6: ax dwi_adc · axial · 3.0mm · 0.60mm/px · z∈[-78,+76]mm · 6 of 54 slices shown]
[im 1/54]
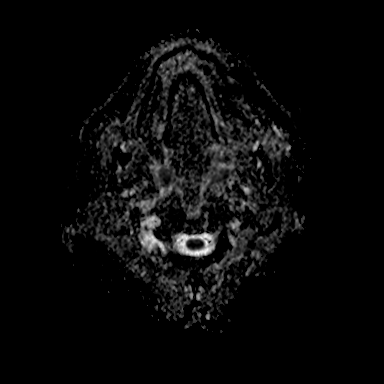
[im 11/54]
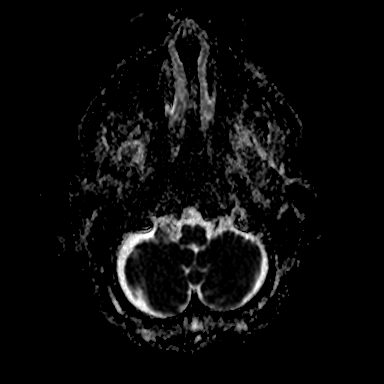
[im 22/54]
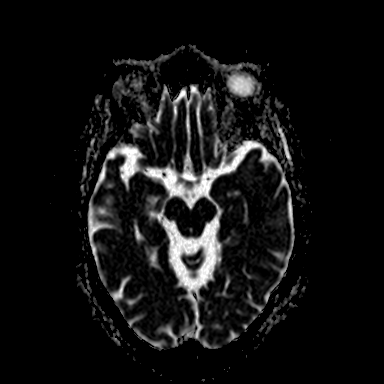
[im 32/54]
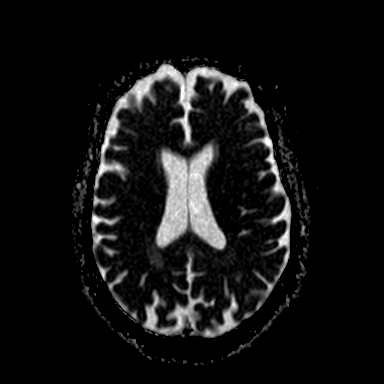
[im 43/54]
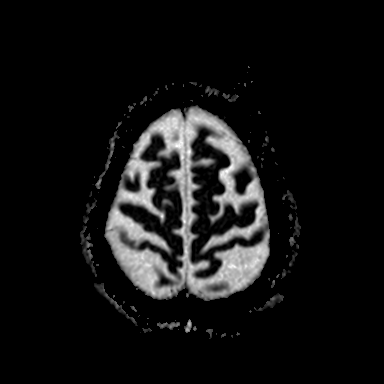
[im 54/54]
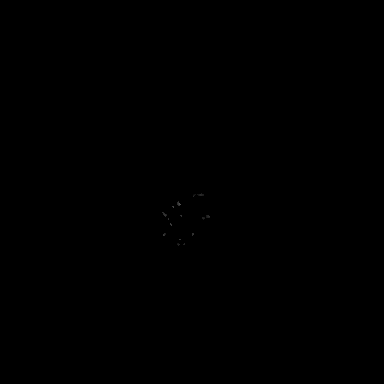

[Series 7: cor dwi_tracew · coronal · 5.0mm · 0.60mm/px · 4 of 45 slices shown]
[im 1/45]
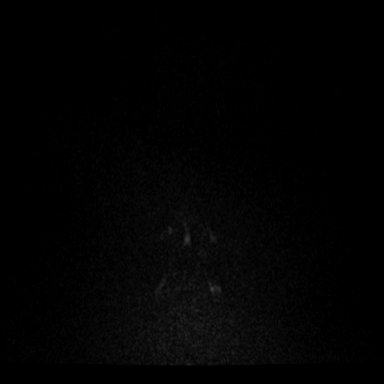
[im 15/45]
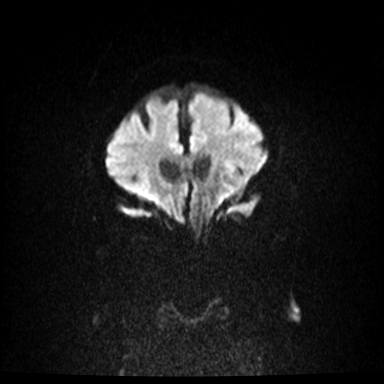
[im 30/45]
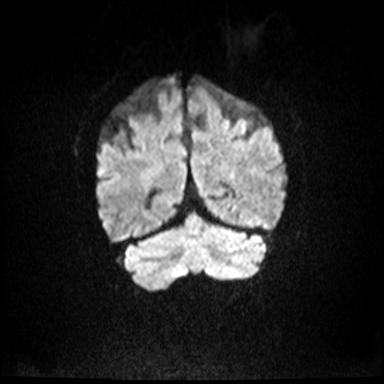
[im 45/45]
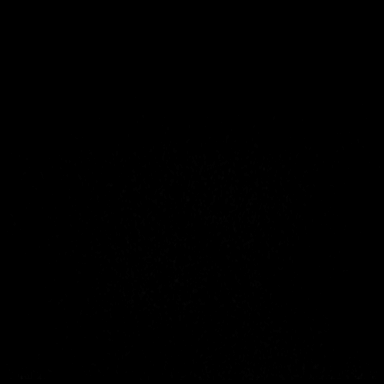

[Series 8: cor dwi_adc · coronal · 5.0mm · 0.60mm/px · 4 of 41 slices shown]
[im 1/41]
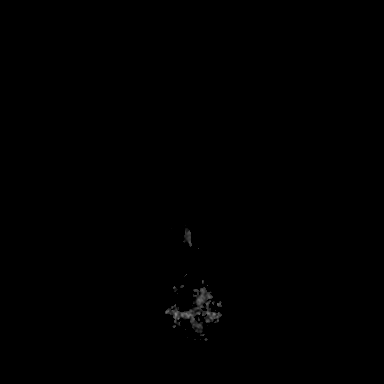
[im 14/41]
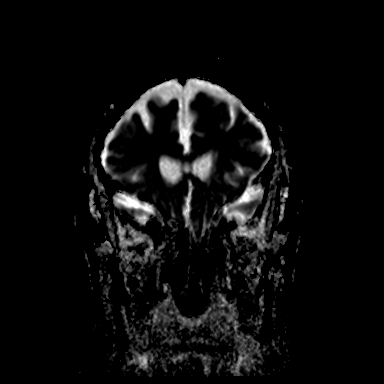
[im 27/41]
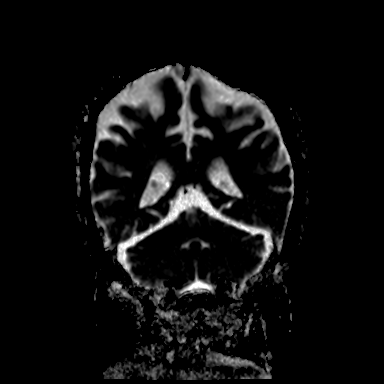
[im 41/41]
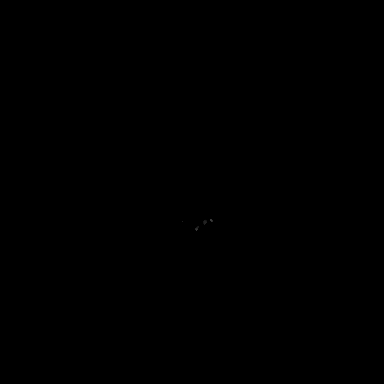

[Series 9: T1 · sagittal · 5.0mm · 0.62mm/px · 2 of 23 slices shown (1 of 2)]
[im 1/23]
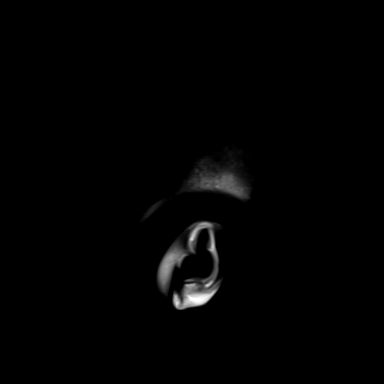
[im 23/23]
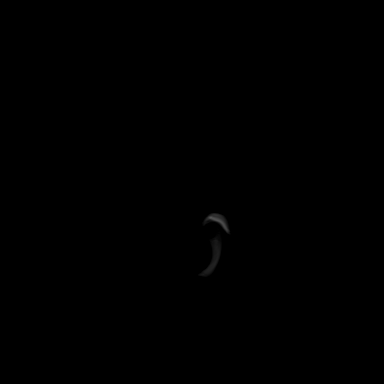

[Series 10: T2 · axial · 5.0mm · 0.53mm/px · z∈[-68,+83]mm · 2 of 27 slices shown (1 of 2)]
[im 1/27]
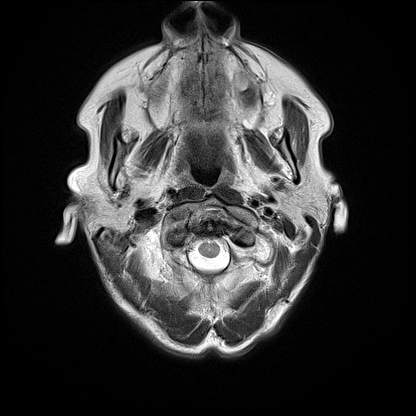
[im 27/27]
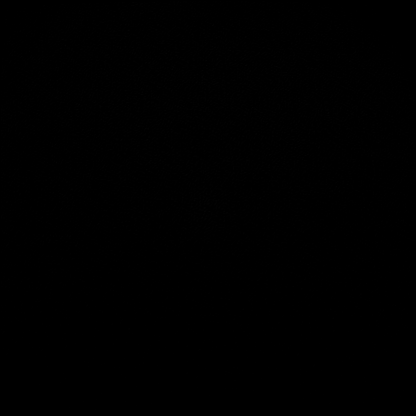

[Series 12: pha_images · axial · 3.0mm · 0.90mm/px · 1 of 58 slices shown]
[im 1/58]
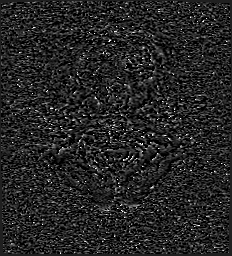

[Series 15: FLAIR · axial · 5.0mm · 1.20mm/px · z∈[-87,+57]mm · 2 of 27 slices shown]
[im 1/27]
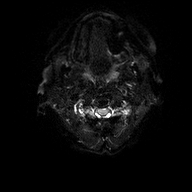
[im 27/27]
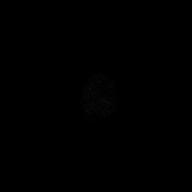

[Series 16: T1 · axial · 5.0mm · 0.90mm/px · z∈[-85,+61]mm · 2 of 27 slices shown (2 of 2)]
[im 1/27]
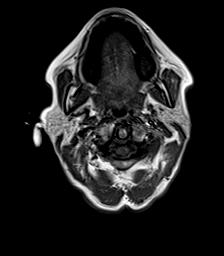
[im 27/27]
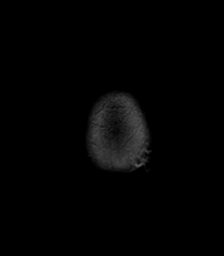

[Series 17: T2 · coronal · 5.0mm · 0.45mm/px · 3 of 31 slices shown (2 of 2)]
[im 1/31]
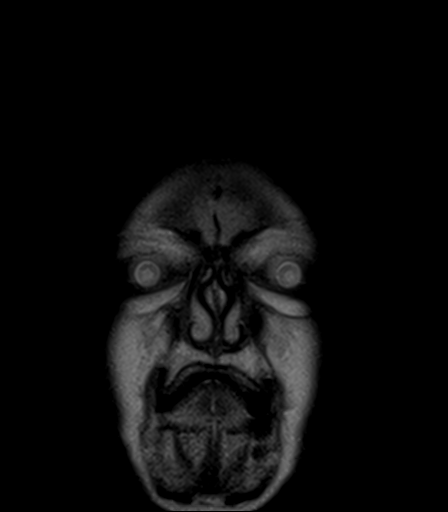
[im 16/31]
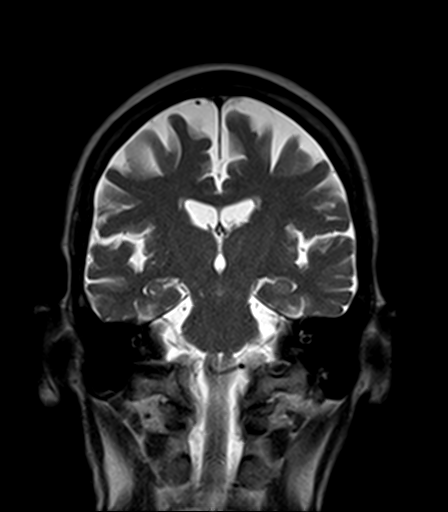
[im 31/31]
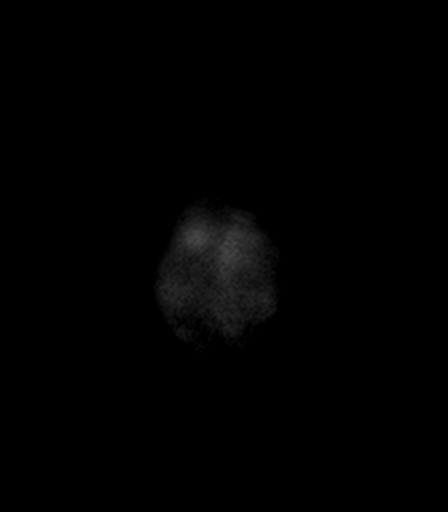

[Series 18: T1 post-contrast · axial · 5.0mm · 0.90mm/px · z∈[-87,+58]mm · 2 of 27 slices shown (1 of 3)]
[im 1/27]
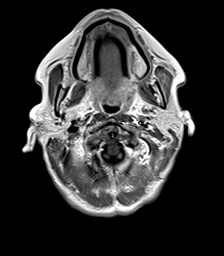
[im 27/27]
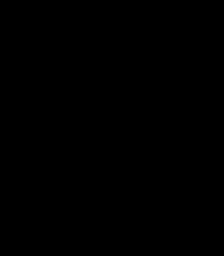

[Series 19: T1 post-contrast · coronal · 5.0mm · 0.90mm/px · 3 of 31 slices shown (2 of 3)]
[im 1/31]
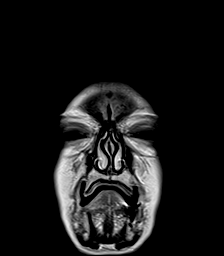
[im 16/31]
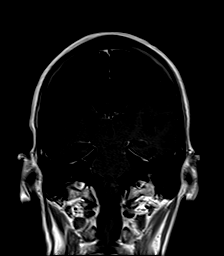
[im 31/31]
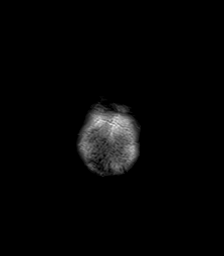

[Series 20: T1 post-contrast · sagittal · 5.0mm · 0.62mm/px · 2 of 23 slices shown (3 of 3)]
[im 1/23]
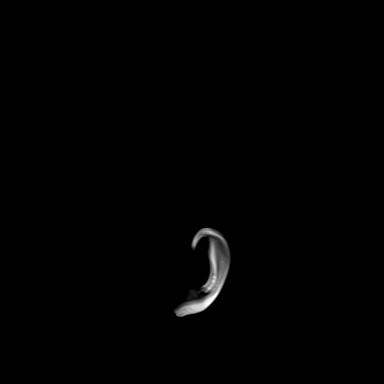
[im 23/23]
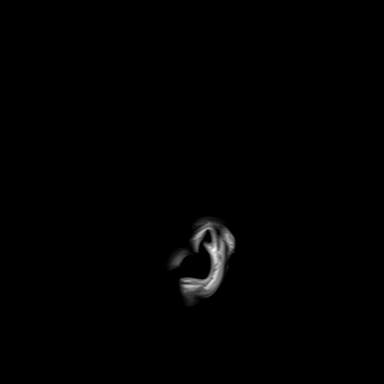

[39 of 48 positions shown; findings below may reference images not displayed]

FINDINGS: Brain: No infarction, hemorrhage, hydrocephalus, extra-axial
collection or mass lesion. Mild FLAIR hyperintensity in the cerebral
white matter attributed to chronic microvascular ischemia. Mild for
age cerebral volume loss without specific pattern. Pituitary is
somewhat prominent on sagittal images, but normal appearing on
coronal postcontrast imaging.

Vascular: Major flow voids are preserved

Skull and upper cervical spine: Negative for marrow lesion.

Sinuses/Orbits: Negative
IMPRESSION: Senescent changes without specific or reversible abnormality.

## 2019-06-22 ENCOUNTER — Encounter: Payer: Self-pay | Admitting: *Deleted

## 2019-06-27 ENCOUNTER — Ambulatory Visit: Payer: Medicare Other | Admitting: Diagnostic Neuroimaging

## 2019-07-16 IMAGING — MG DIGITAL SCREENING BILATERAL MAMMOGRAM WITH TOMO AND CAD
4 series · 4 of 12 positions shown · non-contrast
Comparison: Previous exam(s).

CLINICAL DATA: Screening.

EXAM:
DIGITAL SCREENING BILATERAL MAMMOGRAM WITH TOMO AND CAD

[L CC synth-2D]
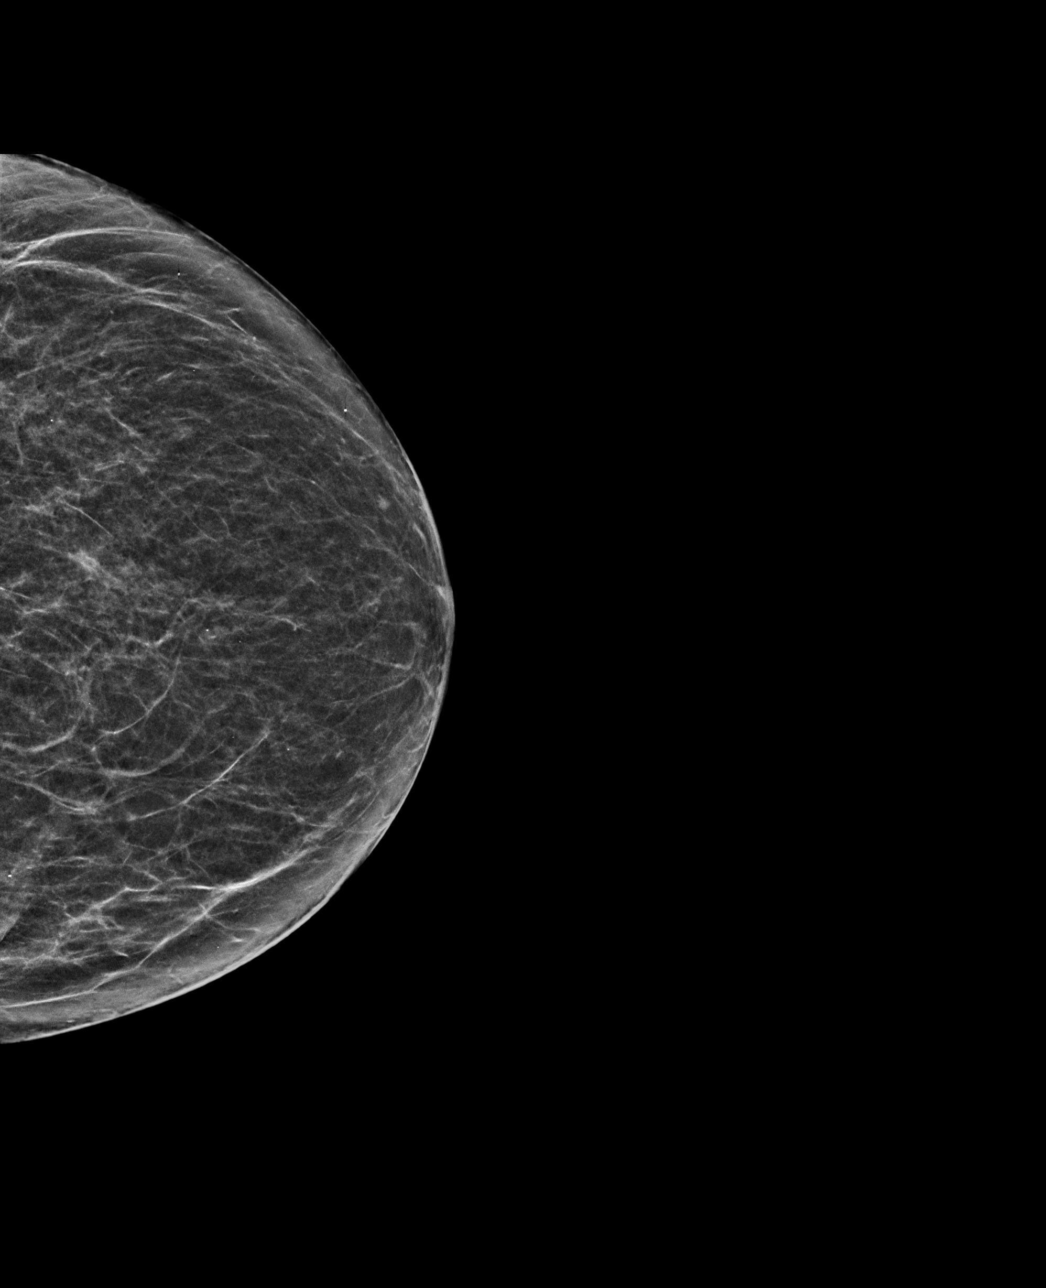

[L MLO synth-2D]
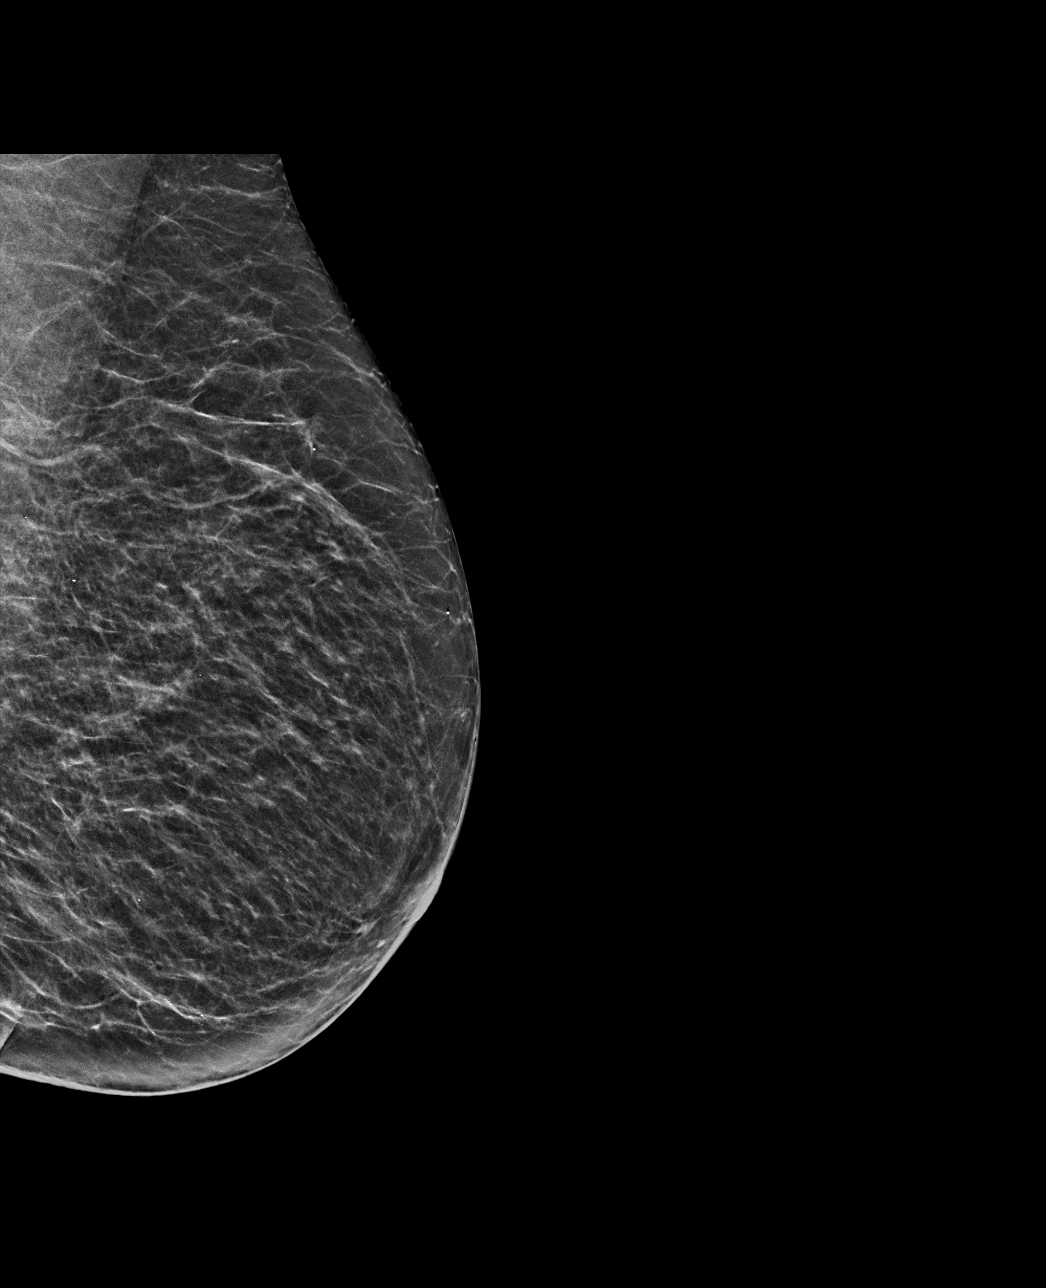

[R MLO tomo · tomo slice 27/54.0]
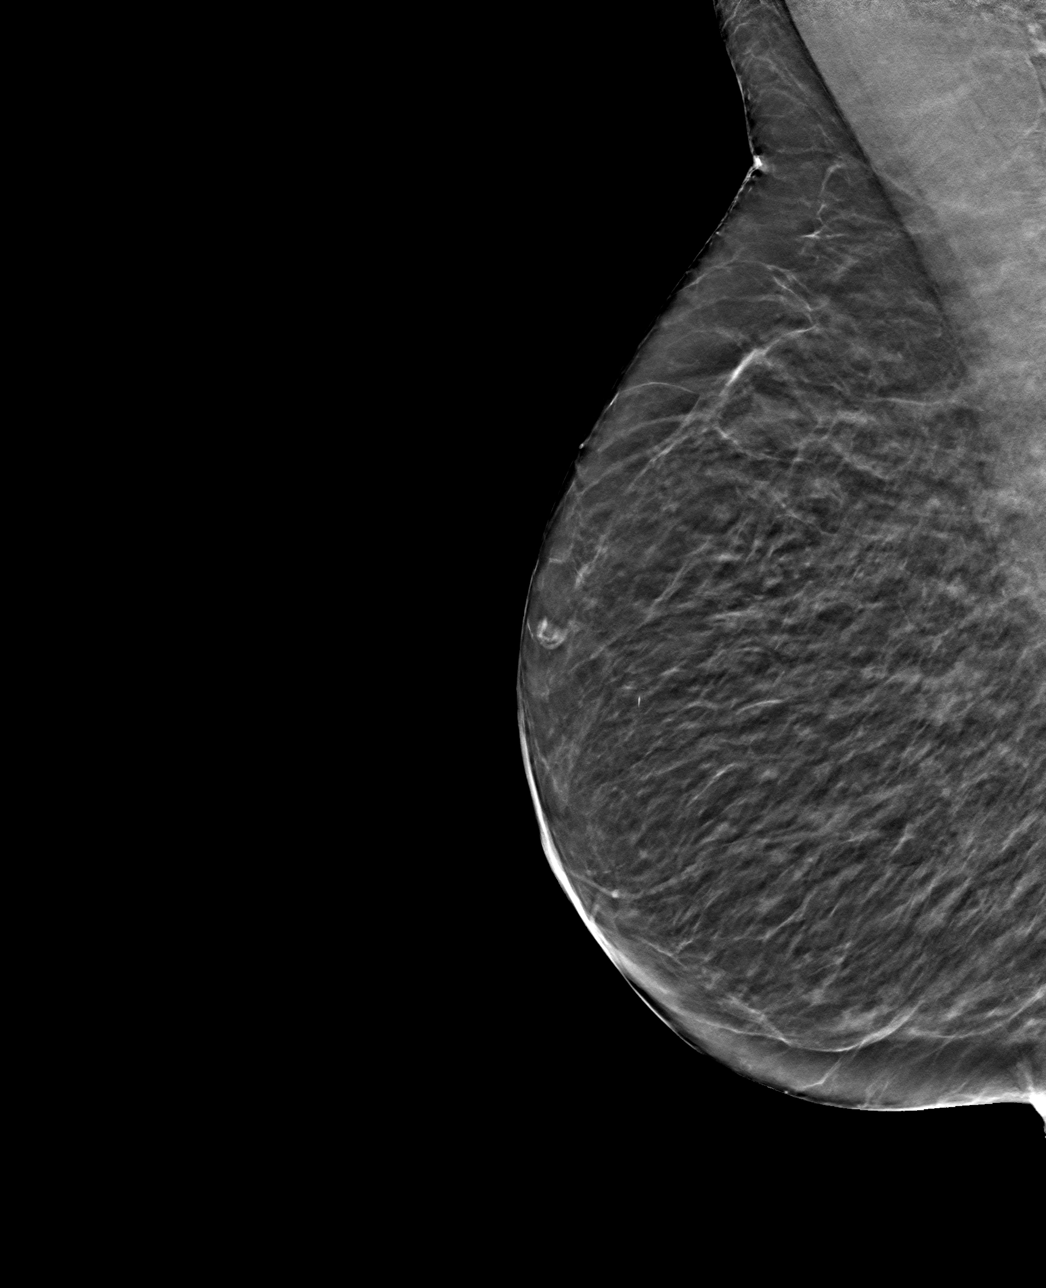

[L CC tomo · tomo slice 26/51.0]
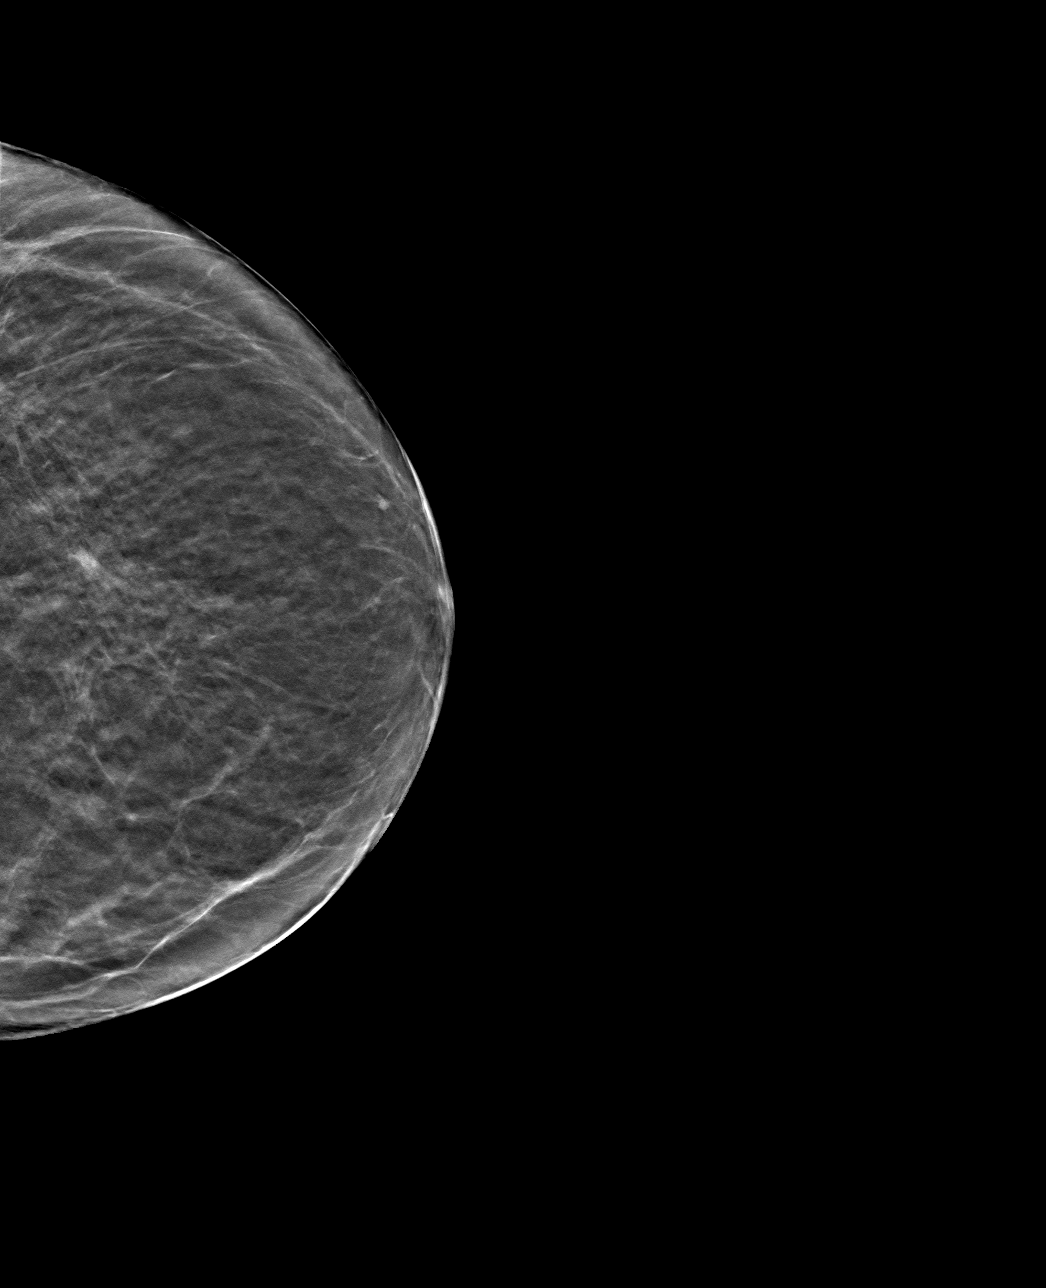

[4 of 12 positions shown; findings below may reference images not displayed]

ACR Breast Density Category b: There are scattered areas of
fibroglandular density.
FINDINGS: There are no findings suspicious for malignancy. Images were
processed with CAD.
IMPRESSION: No mammographic evidence of malignancy. A result letter of this
screening mammogram will be mailed directly to the patient.

RECOMMENDATION:
Screening mammogram in one year. (Code:[id number])

BI-RADS CATEGORY  1: Negative.

## 2020-01-25 ENCOUNTER — Ambulatory Visit: Payer: Medicare Other | Admitting: Physical Therapy

## 2020-01-30 ENCOUNTER — Encounter: Payer: Medicare Other | Admitting: Physical Therapy

## 2020-02-01 ENCOUNTER — Encounter: Payer: Medicare Other | Admitting: Physical Therapy

## 2020-02-06 ENCOUNTER — Encounter: Payer: Medicare Other | Admitting: Physical Therapy

## 2020-02-08 ENCOUNTER — Encounter: Payer: Medicare Other | Admitting: Physical Therapy

## 2020-02-13 ENCOUNTER — Encounter: Payer: Medicare Other | Admitting: Physical Therapy

## 2020-02-15 ENCOUNTER — Encounter: Payer: Medicare Other | Admitting: Physical Therapy

## 2020-02-20 ENCOUNTER — Encounter: Payer: Medicare Other | Admitting: Physical Therapy

## 2020-02-22 ENCOUNTER — Encounter: Payer: Medicare Other | Admitting: Physical Therapy

## 2020-02-27 ENCOUNTER — Encounter: Payer: Medicare Other | Admitting: Physical Therapy

## 2020-02-29 ENCOUNTER — Encounter: Payer: Medicare Other | Admitting: Physical Therapy

## 2022-06-22 ENCOUNTER — Ambulatory Visit
Admission: EM | Admit: 2022-06-22 | Discharge: 2022-06-22 | Disposition: A | Discharge status: Home / Self Care | Payer: Medicare Other | Attending: Family Medicine | Admitting: Family Medicine

## 2022-06-22 ENCOUNTER — Encounter: Payer: Self-pay | Admitting: Emergency Medicine

## 2022-06-22 DIAGNOSIS — U071 COVID-19: Secondary | ICD-10-CM

## 2022-06-22 MED ORDER — NIRMATRELVIR/RITONAVIR (PAXLOVID)TABLET: 3.0000 | ORAL_TABLET | Freq: Two times a day (BID) | ORAL | 0 refills | Status: AC

## 2022-06-22 NOTE — ED Provider Notes (Signed)
[facility]    CSN: [id number] Arrival date & time: 06/22/22  1459      History   Chief Complaint Chief Complaint  Patient presents with   Covid Positive    HPI 76 year old female with dementia presents for evaluation of the above.  Patient and her husband have tested positive for COVID.  History is given predominantly by the son.  She has had symptoms since yesterday.  She has had runny nose and cough.  No fever.  Patient unable to give history herself due to dementia.  No reported sick contacts other than her husband.  Son states that he is unsure of how they contracted COVID-19.  No shortness of breath.  No other reported symptoms.  No other complaints.  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      Home Medications    Prior to Admission medications   Medication Sig Start Date End Date Taking? Authorizing Provider  nirmatrelvir/ritonavir EUA (PAXLOVID) 20 x 150 MG & 10 x 100MG  TABS Take 3 tablets by mouth 2 (two) times daily for 5 days. Take nirmatrelvir (150 mg) two tablets twice daily for 5 days and ritonavir (100 mg) one tablet twice daily for 5 days. GFR is 72. 06/22/22 06/27/22 Yes Zygmund Passero G, DO  aspirin EC 325 MG tablet Take 1 tablet (325 mg total) by mouth 2 (two) times daily. Patient taking differently: Take 81 mg by mouth daily.  03/27/17   03/29/17, MD  atorvastatin (LIPITOR) 10 MG tablet Take 10 mg by mouth every morning.     [provider]  Calcium Carb-Cholecalciferol (CALCIUM 600 +  D PO) Take 1 tablet by mouth daily.    [provider]  Multiple Vitamin (MULTIVITAMIN WITH MINERALS) TABS tablet Take 1 tablet by mouth daily.    [provider]  Omega-3 Fatty Acids (FISH OIL) 1000 MG CAPS Take 1,000 mg by mouth daily.    [provider]  polyethylene glycol powder (GLYCOLAX/MIRALAX) powder 17 g once or twice daily for constipation. 12/21/18   12/23/18, DO    Family History Family History  Problem Relation Age of Onset   Diabetes Sister    Diabetes Brother    Breast cancer Neg Hx     Social History Social History   Tobacco Use   Smoking status: Never   Smokeless tobacco: Never   Tobacco comments:    < 1 ppd  Vaping Use   Vaping Use: Never used  Substance Use Topics   Alcohol use: No   Drug use: No     Allergies   Codeine and Amoxicillin   Review of Systems Review of Systems Per HPI  Physical Exam Triage Vital Signs ED Triage Vitals  Enc Vitals Group     BP 06/22/22 1510 128/69     Pulse Rate 06/22/22 1510 87     Resp 06/22/22 1510 14     Temp 06/22/22 1510 98.7 F (37.1  C)     Temp Source 06/22/22 1510 Oral     SpO2 06/22/22 1510 98 %     Weight 06/22/22 1508 160 lb 0.9 oz (72.6 kg)     Height 06/22/22 1508 5\' 11"  (1.803 m)     Head Circumference --      Peak Flow --      Pain Score 06/22/22 1508 0     Pain Loc --      Pain Edu? --      Excl. in [place]? --    Updated Vital Signs BP 128/69 (BP Location: Left Arm)   Pulse 87   Temp 98.7 F (37.1 C) (Oral)   Resp 14   Ht 5\' 11"  (1.803 m)   Wt 72.6 kg   SpO2 98%   BMI 22.32 kg/m   Visual Acuity Right Eye Distance:   Left Eye Distance:   Bilateral Distance:    Right Eye Near:   Left Eye Near:    Bilateral Near:     Physical Exam Vitals and nursing note reviewed.  Constitutional:      Appearance: Normal appearance.  HENT:     Head: Normocephalic and atraumatic.  Eyes:     General:        Right eye: No discharge.        Left eye: No  discharge.     Conjunctiva/sclera: Conjunctivae normal.  Cardiovascular:     Rate and Rhythm: Normal rate and regular rhythm.  Pulmonary:     Effort: Pulmonary effort is normal.     Breath sounds: Normal breath sounds. No wheezing, rhonchi or rales.  Neurological:     Mental Status: She is alert.    UC Treatments / Results  Labs (all labs ordered are listed, but only abnormal results are displayed) Labs Reviewed - No data to display  EKG   Radiology No results found.  Procedures Procedures (including critical care time)  Medications Ordered in UC Medications - No data to display  Initial Impression / Assessment and Plan / UC Course  I have reviewed the triage vital signs and the nursing notes.  Pertinent labs & imaging results that were available during my care of the patient were reviewed by me and considered in my medical decision making (see chart for details).    76 year old female presents with COVID-19.  Treating with Paxlovid.  Final Clinical Impressions(s) / UC Diagnoses   Final diagnoses:  COVID     Discharge Instructions      Hold atorvastatin while taking Paxlovid.  If she worsens, she needs to go to the hospital.  Take care  Dr. Lacinda Axon    ED Prescriptions     Medication Sig [place]. Provider   nirmatrelvir/ritonavir EUA (PAXLOVID) 20 x 150 MG & 10 x 100MG  TABS Take 3 tablets by mouth 2 (two) times daily for 5 days. Take nirmatrelvir (150 mg) two tablets twice daily for 5 days and ritonavir (100 mg) one tablet twice daily for 5 days. GFR is 72. 30 tablet [place], [place] G, DO      PDMP not reviewed this encounter.   Coral Spikes, [place] 06/22/22 1559

## 2022-06-22 NOTE — ED Triage Notes (Signed)
Patient c/o cold symptoms that started last night.  Patient took home covid test this morning and was positive for covid.

## 2022-06-22 NOTE — Discharge Instructions (Signed)
Hold atorvastatin while taking Paxlovid.  If she worsens, she needs to go to the hospital.  Take care  Dr. Lacinda Axon

## 2022-08-06 DEATH — deceased
